# Patient Record
Sex: Male | Born: 1970 | Hispanic: No | Smoking: Never smoker
Health system: Southern US, Community
[De-identification: ages and names within clinical notes are randomized; demographics above are authoritative.]

## PROBLEM LIST (undated history)

## (undated) DIAGNOSIS — Z86010 Personal history of colonic polyps: Secondary | ICD-10-CM

## (undated) HISTORY — PX: COLONOSCOPY: SHX5424

---

## 2007-05-06 ENCOUNTER — Encounter: Payer: Self-pay | Admitting: Emergency Medicine

## 2007-05-07 ENCOUNTER — Encounter (INDEPENDENT_AMBULATORY_CARE_PROVIDER_SITE_OTHER): Payer: Self-pay | Admitting: Internal Medicine

## 2007-05-07 ENCOUNTER — Inpatient Hospital Stay (HOSPITAL_COMMUNITY): Admission: EM | Admit: 2007-05-07 | Discharge: 2007-05-10 | Payer: Self-pay | Admitting: Internal Medicine

## 2007-05-07 ENCOUNTER — Ambulatory Visit: Payer: Self-pay | Admitting: Internal Medicine

## 2011-02-20 NOTE — Discharge Summary (Signed)
[name] Moore, [name] Moore           ACCOUNT NO.:  [id number]   MEDICAL RECORD NO.:  [id number]          PATIENT TYPE:  INP   LOCATION:  3729                         FACILITY:  [facility]   PHYSICIAN:  Altha Harm, MDDATE OF BIRTH:  08/26/1971   DATE OF ADMISSION:  05/07/2007  DATE OF DISCHARGE:  05/08/2007                               DISCHARGE SUMMARY   FINAL DISCHARGE DIAGNOSES:  1. Atypical chest pain.  2. History of gastroesophageal reflux disease.  3. Hyperlipidemia.   DISCHARGE MEDICATIONS:  1. Pravastatin 40 mg p.o. daily.  2. Prilosec OTC 20 mg p.o. daily.   CONSULTATIONS:  Dr. Sharyn Lull, cardiology.   PROCEDURES:  None.   DIAGNOSTIC STUDIES:  1. Stress Myoview.  Stress test negative but awaiting results of      myoview.  2. 2D echocardiogram which shows no wall motion abnormalities, normal      left ventricular function with an ejection fraction of 65%, and      mildly increased left ventricular wall built thickness.   CODE STATUS:  FULL CODE.   PRIMARY CARE PHYSICIAN:  Presently unassigned.   CHIEF COMPLAINT:  Chest pain.   HISTORY OF PRESENT ILLNESS:  Please see the H and P dictated by Dr.  Roxan Hockey for details of the HPI.   HOSPITAL COURSE:  1. Chest pain:  The patient had a complaint of chest pain and was      ruled out with serial enzymes for resting ischemia.  With the      patient's gender, history of tobacco use disorder, and history of      hyperlipidemia, it was thought that it was prudent for the patient      to undergo a stress test prior to discharge.  The patient underwent      the stress test.  From preliminary findings, the stress test is      negative.  The Myoview portion of the stress is still pending.  2. Hyperlipidemia:  The patient was found to have elevated      triglycerides of 225, and low HDL.  The patient was started on      Pravastatin to be taken on a daily basis as an outpatient.  The      patient has been advised to follow up  with a primary care physician      to have the LFTs and cholesterol checked within 6 weeks.  Please      note that the patient at baseline, LFTs are all normal.  3. Tobacco use disorder:  The patient is counseled against tobacco      cessation.  However, he is in a precontemplative state at the      moment.  4. History of gastroesophageal reflux disease:  The patient states      that he has a history of reflux and was taking Omeprazole, which he      has discontinued by himself in the past.  The patient is restarted      on Prilosec OTC as an outpatient and is to continue until followed      up by his  primary care physician.   CONDITION AT THE TIME OF DISCHARGE:  Stable.   DIETARY RESTRICTIONS:  The patient should be on a low-cholesterol diet.   PHYSICAL RESTRICTIONS:  None.   FOLLOWUP:  The patient is to follow up with a primary care physician  within 1 week of discharge if myoview turns out to be normal.      Altha Harm, MD  Electronically Signed     MAM/MEDQ  D:  05/08/2007  T:  05/08/2007  Job:  [id number]   cc:   Eduardo Osier. Sharyn Lull, M.D.

## 2011-02-20 NOTE — Discharge Summary (Signed)
[name] Moore, [name] Moore           ACCOUNT NO.:  [id number]   MEDICAL RECORD NO.:  [id number]          PATIENT TYPE:  INP   LOCATION:  3729                         FACILITY:  [facility]   PHYSICIAN:  Altha Harm, MDDATE OF BIRTH:  1971-01-27   DATE OF ADMISSION:  05/07/2007  DATE OF DISCHARGE:  05/10/2007                               DISCHARGE SUMMARY   ADDENDUM   DISPOSITION:  To home.   ADDITIONAL DISCHARGE DIAGNOSIS:  Nonocclusive coronary artery disease.   ADDITIONAL MEDICATIONS:  Aspirin 81 mg p.o. daily.   HOSPITAL COURSE:  The patient was found to have a wall motion defect on  the Myoview.  Thus the patient was scheduled for and underwent a cardiac  catheterization.  Cardiac catheterization showed nonocclusive vessel  disease in the range of 10-15%.  Recommendations were made for aspirin  to be taken on a daily basis, and risk modification with cholesterol  control.   The patient also has a tobacco use disorder and has been counseled  against tobacco smoking.  The patient states that he will cut down on  his tobacco use and approach his primary care physician for assistance  in smoking cessation.  Smoking cessation counseling was done while the  patient was in the hospital; however, the patient does not want to  embark on an outpatient program at this time.   CONDITION ON DISCHARGE:  The patient's condition on discharge in stable.  The patient is up, ambulating.  He has had no post catheterization  complications.      Altha Harm, MD  Electronically Signed     MAM/MEDQ  D:  05/10/2007  T:  05/10/2007  Job:  [id number]   cc:   Eduardo Osier. Sharyn Lull, M.D.

## 2011-02-20 NOTE — Cardiovascular Report (Signed)
Moore, [name]           ACCOUNT NO.:  [id number]   MEDICAL RECORD NO.:  [id number]          PATIENT TYPE:  INP   LOCATION:  3729                         FACILITY:  [facility]   PHYSICIAN:  Mohan N. Sharyn Lull, M.D. DATE OF BIRTH:  1971/03/20   DATE OF PROCEDURE:  05/09/2007  DATE OF DISCHARGE:                            CARDIAC CATHETERIZATION   PROCEDURE:  Left cardiac cath with selective left and right coronary  angiography, LV graft via right groin using Judkins technique.   INDICATIONS FOR PROCEDURE:  Mr. [name] Moore is a 40 year old [place]  male with past medical history significant for hypercholesteremia,  tobacco abuse, was admitted due to recurrent retrosternal chest pain  described as pressure like someone standing on the chest off and on  since yesterday, without associated nausea, vomiting.  Pain was  localized grade 10/10, states that chest pain occasionally increases  with deep breathing.  Denies any cough, fever or chills.  Denies  exertional chest pain.  Denies palpitation, lightheadedness or syncope.  Denies PND, orthopnea or leg swelling.   PAST MEDICAL HISTORY:  As above.   PAST SURGICAL HISTORY:  None.   ALLERGIES:  None.   MEDICATIONS:  None.   SOCIAL HISTORY:  He is a widower.  His wife died of breast cancer  recently.  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  Smokes pipe for 10+ years, no  history of alcohol abuse.   FAMILY HISTORY:  Is positive for cancer.   PHYSICAL EXAMINATION:  On examination he is alert and oriented x3 in no  acute distress.  Blood pressure was 110/62, pulse was 64.  Conjunctivae  was pink.  NECK:  Supple.  No JVD, no bruit.  LUNGS:  Were clear to auscultation without rhonchi or rales.  CARDIOVASCULAR:  S1 and S2 was normal.  There was no S3 gallop or  murmur.  ABDOMEN was soft.  Bowel sounds were present, nontender.  EXTREMITIES:  There is no clubbing, cyanosis  or edema.   EKG showed normal sinus rhythm with no acute ischemic changes.  Three  sets of cardiac enzymes were negative.  The patient subsequently  underwent stress Myoview, exercised for 10 minutes on Bruce protocol.  Peak heart rate achieved was 176, peak blood pressure was 168/69.  EKG  showed normal sinus rhythm with no acute ischemic changes.  At the peak  of exercise the patient had occasional ventricular quadrigeminy.  His  Myoview scan showed reversible ischemia of the anterior wall with normal  ejection fraction.  Due to chest pain, positive stress Myoview discussed  with the patient and his brother-in-law regarding left cath, possible  PTCA stenting, its risks and benefits, i.e. death, MI, stroke, need for  emergency CABG, risk of restenosis, local vascular complications, etc.,  accepted and consented for the procedure.   PROCEDURE:  After obtaining informed consent the patient was brought to  the cath lab and was placed on fluoroscopy table.  The right groin was  prepped and draped in usual fashion.  2% Xylocaine was used for local  anesthesia in the right groin.  With the help of thin-wall needle 6-  French arterial sheath was placed.  Sheath was aspirated and flushed.  Next a 6-French left Judkins catheter was advanced over the wire under  fluoroscopic guidance up to the ascending aorta.  Wire was pulled out,  the catheter was aspirated and connected to the manifold.  Catheter was  further advanced and engaged into left  coronary ostium.  Multiple views  of the left system were taken.  Next, catheter was disengaged and was  pulled out over the wire and was replaced with 6-French right Judkins  catheter which was advanced over the wire under fluoroscopic guidance up  to the ascending aorta.  Wire was pulled out, the catheter was aspirated  and connected to the manifold.  Catheter was further advanced and  engaged into right coronary ostium.  Multiple views of the right  system  were taken.  Next, the catheter was disengaged and was pulled out over  the wire and was replaced with 6-French pigtail catheter which was  advanced over the wire under fluoroscopic guidance up to the ascending  aorta.  Wire was pulled, out the catheter was aspirated and connected to  the manifold.  Catheter was further advanced across the aortic valve  into the LV.  LV pressures were recorded.  Next, LV grafting was done in  30 dB RAO position.  Post angiographic pressures were recorded from LV  and then pullback pressures were recorded from the aorta.  There was no  gradient across the aortic valve.  Next a pigtail catheter was pulled  out over the wire.  Sheaths aspirated and flushed.  Of note, the  patient's left main was very short, left main circumflex system was  cannulated with JR-4 non selectively and LAD was cannulated with a JL-  3.5 selectively.   FINDINGS:  LV showed good LV systolic function, EF of 55-60%.  Left main  was short which was patent.  LAD has 10-15% proximal and mid stenosis.  Diagonal one was very very small. Diagonal 2 and 3 were small which were  patent.  Left circumflex was large which has 10-15% ostial stenosis.  OM1 and OM-2 were very very small.  OM-3 was large which was patent.  OM-  4 was small which was patent.  RCA was small, nondominant which was  patent.  The patient had left dominant coronary system.  The patient  tolerated procedure well.  There are no complications.  The patient was  transferred to recovery room in stable condition.      [name] Moore. Sharyn Lull, M.D.  Electronically Signed     MNH/MEDQ  D:  05/09/2007  T:  05/09/2007  Job:  [id number]   cc:   [employer]

## 2011-02-20 NOTE — H&P (Signed)
NAMEConni Moore DECLYN, DELSOL           ACCOUNT NO.:  [id number]   MEDICAL RECORD NO.:  [id number]          PATIENT TYPE:  EMS   LOCATION:  ED                           [facility]ACILITY[facility]   PHYSICIAN:  Della Goo, M.D. DATE OF BIRTH:  04/18/1971   DATE OF ADMISSION:  05/06/2007  DATE OF DISCHARGE:  05/07/2007                              HISTORY & PHYSICAL   This is an unassigned patient.   CHIEF COMPLAINT:  Chest pain.   HISTORY OF PRESENT ILLNESS:  This is a 40 year old male who presented to  the emergency department secondary to complaints of several episodes of  chest pain that began at approximately 5 p.m. in the afternoon prior to  admission.  He reports taking Tylenol after the first episode and the  pain resolved.  He reports the pain later returned in the evening with  exertion, rated the intensity of the pain at that time of 10/10, and it  was unrelieved until arrival in the emergency department and the patient  was given nitroglycerin sublingual x1.  He reports having shortness of  breath and diaphoresis associated with this pain.  No nausea or  vomiting.  The pain was located in the mid chest area without radiation.  Described the pain as being a dull, tight feeling.  He denies having any  previous similar episodes.  The patient does have risk factors of  tobacco usage, smokes a pipe.  He has no family history of coronary  artery disease that he knows of.   PAST MEDICAL HISTORY:  No previous medical problems.   PAST SURGICAL HISTORY:  None.   MEDICATIONS:  None.   ALLERGIES:  No known drug allergies.   SOCIAL HISTORY:  The patient is a widower.  He is visiting from [place] at  this time.  He has two children.  His wife died approximately eight  months ago of breast cancer at age 5.  Tobacco history:  Smokes a pipe.  No history of alcohol usage and no history of illicit drug usage.   FAMILY HISTORY:  Negative for coronary artery disease, hypertension,  diabetes and  cancers.   REVIEW OF SYSTEMS:  Pertinents are mentioned above.   PHYSICAL EXAMINATION FINDINGS:  This is a 40 year old overweight male in  discomfort but no acute distress.  VITAL SIGNS:  Temperature 97.5, blood pressure 124/73, heart rate 87,  respirations 16, O2 saturation 99% on 2 L nasal cannula oxygen.  HEENT:  Normocephalic, atraumatic.  Pupils equally round, reactive to  light.  Extraocular muscles are intact.  Funduscopic benign.  Oropharynx  is clear.  NECK:  Supple, full range of motion.  No thyromegaly, adenopathy or  jugular venous distension.  CARDIOVASCULAR:  Regular rate and rhythm.  No murmurs, gallops or rubs.  LUNGS:  Clear to auscultation bilaterally.  ABDOMEN:  Positive bowel sounds, soft, nontender, nondistended.  No  hepatosplenomegaly.  No rebound or guarding.  EXTREMITIES:  Without cyanosis, clubbing or edema.  NEUROLOGIC:  Alert and oriented x3.  Nonfocal.   LABORATORY STUDIES:  White blood cell count 9.3, hemoglobin 16.5,  hematocrit 47.6, platelets 238, neutrophils 65%,  lymphocytes 30%.  Sodium 140, potassium 3.3, chloride 103, carbon dioxide 27, BUN 7,  creatinine 0.80, glucose 114.  Cardiac enzymes:  Myoglobin 35.5, CK-MB  less than 1.0, troponin less than 0.05.  D-dimer less than 0.22.  EKG  reveals a normal sinus rhythm without acute ST-segment changes.   ASSESSMENT:  This is a 40 year old male being admitted with:  1. Chest pain.  2. Hypokalemia.  3. Night sweats.   PLAN:  The patient will be admitted to a telemetry area for cardiac  monitoring and cardiac enzymes will be performed.  The patient will be  placed on medical therapy of nitroglycerin paste, oxygen, aspirin, GI  and DVT prophylaxis.  The patient will receive replacement potassium  therapy.  He will be monitored for further changes.      Della Goo, M.D.  Electronically Signed     HJ/MEDQ  D:  05/07/2007  T:  05/07/2007  Job:  [id number]

## 2011-07-23 LAB — COMPREHENSIVE METABOLIC PANEL
ALT: 31
AST: 14
Calcium: 9.6
Creatinine, Ser: 0.79
GFR calc Af Amer: >60
GFR calc non Af Amer: >60
Glucose, Bld: 79
Sodium: 140
Total Protein: 6.6

## 2011-07-23 LAB — CK TOTAL AND CKMB (NOT AT ARMC)
CK, MB: 1
Relative Index: 0.8
Relative Index: INVALID
Relative Index: INVALID
Total CK: 45
Total CK: 45

## 2011-07-23 LAB — CBC
MCV: 84.8
RBC: 5.61
WBC: 9.3

## 2011-07-23 LAB — TROPONIN I
Troponin I: 0.01
Troponin I: 0.02

## 2011-07-23 LAB — BASIC METABOLIC PANEL
Chloride: 103
Creatinine, Ser: 0.8
GFR calc Af Amer: >60
GFR calc non Af Amer: >60

## 2011-07-23 LAB — APTT: aPTT: 36

## 2011-07-23 LAB — POCT CARDIAC MARKERS
CKMB, poc: <1 — ABNORMAL LOW
Myoglobin, poc: 35.5
Operator id: 4533
Operator id: 4533
Troponin i, poc: <0.05

## 2011-07-23 LAB — LIPID PANEL
Cholesterol: 162
LDL Cholesterol: 99
Total CHOL/HDL Ratio: 9

## 2011-07-23 LAB — B-NATRIURETIC PEPTIDE (CONVERTED LAB)
Pro B Natriuretic peptide (BNP): <30
Pro B Natriuretic peptide (BNP): <30

## 2011-07-23 LAB — DIFFERENTIAL
Eosinophils Absolute: 0.2
Lymphocytes Relative: 30
Lymphs Abs: 2.7
Monocytes Relative: 3
Neutro Abs: 6
Neutrophils Relative %: 65

## 2011-07-23 LAB — TSH: TSH: 3.098

## 2014-03-08 ENCOUNTER — Encounter: Payer: Self-pay | Admitting: Gastroenterology

## 2014-05-05 ENCOUNTER — Encounter (HOSPITAL_COMMUNITY): Payer: Self-pay | Admitting: Emergency Medicine

## 2014-05-05 ENCOUNTER — Emergency Department (HOSPITAL_COMMUNITY)
Admission: EM | Admit: 2014-05-05 | Discharge: 2014-05-05 | Disposition: A | Discharge status: Home / Self Care | Payer: Medicaid Other | Attending: Emergency Medicine | Admitting: Emergency Medicine

## 2014-05-05 ENCOUNTER — Emergency Department (HOSPITAL_COMMUNITY): Payer: Medicaid Other

## 2014-05-05 DIAGNOSIS — M25569 Pain in unspecified knee: Secondary | ICD-10-CM | POA: Diagnosis not present

## 2014-05-05 DIAGNOSIS — Z79899 Other long term (current) drug therapy: Secondary | ICD-10-CM | POA: Diagnosis not present

## 2014-05-05 DIAGNOSIS — Z87828 Personal history of other (healed) physical injury and trauma: Secondary | ICD-10-CM | POA: Diagnosis not present

## 2014-05-05 DIAGNOSIS — M25561 Pain in right knee: Secondary | ICD-10-CM

## 2014-05-05 MED ORDER — HYDROCODONE-ACETAMINOPHEN 5-325 MG PO TABS: ORAL_TABLET | ORAL | Status: DC

## 2014-05-05 MED ORDER — MELOXICAM 7.5 MG PO TABS: 7.5000 mg | ORAL_TABLET | Freq: Every day | ORAL | Status: DC

## 2014-05-05 NOTE — ED Notes (Signed)
C/O right knee pain x 6 months. No known injury. States had xray 3 months ago "they didn't find anything". No deformity.

## 2014-05-05 NOTE — ED Provider Notes (Signed)
Medical screening examination/treatment/procedure(s) were performed by non-physician practitioner and as supervising physician I was immediately available for consultation/collaboration.   EKG Interpretation None        William Kalie Cabral, MD 05/05/14 1446 

## 2014-05-05 NOTE — ED Provider Notes (Signed)
CSN: [id number]     Arrival date & time 05/05/14  0945 History  This chart was scribed for non-physician practitioner, Renne Crigler, PA-C, working with Dagmar Hait, MD by Charline Bills, ED Scribe. This patient was seen in room TR09C/TR09C and the patient's care was started at 11:45 AM.   Chief Complaint  Patient presents with  . Knee Pain   The history is provided by the patient. No language interpreter was used.   HPI Comments: [name] Moore is a 43 y.o. male who presents to the Emergency Department complaining of intermittent R knee pain onset 1 year ago following an injury. Pt states that pain resolved but returned 6 months ago. Pt has been seen for same symptoms and given pain medication with no relief. Pt has had XR obtained previously that showed no fractures. He reports intermittent clicking, popping and stiffness in his R knee. Pt also reports difficulty sleeping at times due to severity of pain. Pain is exacerbated with cold environments. Pt is currently taking Tramadol for pain.   History reviewed. No pertinent past medical history. History reviewed. No pertinent past surgical history. No family history on file. History  Substance Use Topics  . Smoking status: Never Smoker   . Smokeless tobacco: Not on file  . Alcohol Use: No    Review of Systems  Constitutional: Negative for activity change.  Musculoskeletal: Positive for arthralgias. Negative for back pain, gait problem, joint swelling and neck pain.  Skin: Negative for wound.  Neurological: Negative for weakness and numbness.   Allergies  Review of patient's allergies indicates no known allergies.  Home Medications   Prior to Admission medications   Medication Sig Start Date End Date Taking? Authorizing Provider  acetaminophen (TYLENOL) 500 MG tablet Take 1,000 mg by mouth every 6 (six) hours as needed for moderate pain or headache.   Yes Historical Provider, MD  omeprazole (PRILOSEC) 20 MG capsule Take  20 mg by mouth daily.   Yes Historical Provider, MD  traMADol (ULTRAM) 50 MG tablet Take 50 mg by mouth every 6 (six) hours as needed for moderate pain.   Yes Historical Provider, MD   Triage Vitals: BP 119/90  Temp(Src) 98.1 F (36.7 C) (Oral)  Resp 18  Ht 5\' 10"  (1.778 m)  Wt 215 lb (97.523 kg)  BMI 30.85 kg/m2  SpO2 97% Physical Exam  Nursing note and vitals reviewed. Constitutional: He appears well-developed and well-nourished.  HENT:  Head: Normocephalic and atraumatic.  Eyes: Conjunctivae are normal.  Neck: Normal range of motion. Neck supple.  Cardiovascular: Normal pulses.   Pulmonary/Chest: Effort normal.  Musculoskeletal: He exhibits tenderness. He exhibits no edema.       Right hip: Normal.       Right knee: He exhibits normal range of motion and no swelling. Tenderness found. Medial joint line tenderness noted. No lateral joint line tenderness noted.       Right ankle: Normal.  Crepitus felt with ROM of R knee.   Neurological: He is alert. No sensory deficit.  Motor, sensation, and vascular distal to the injury is fully intact.   Skin: Skin is warm and dry.  Psychiatric: He has a normal mood and affect. His behavior is normal.   ED Course  Procedures (including critical care time) DIAGNOSTIC STUDIES: Oxygen Saturation is 97% on RA, normal by my interpretation.    COORDINATION OF CARE: 11:52 AM-Discussed treatment plan which includes XR, anti-inflammatories, Vicodin, and referral to orthopedist with pt at bedside and  pt agreed to plan.   Labs Review Labs Reviewed - No data to display  Imaging Review Dg Knee Complete 4 Views Right  05/05/2014   CLINICAL DATA:  Chronic medial knee pain, twisting injury 1 year ago  EXAM: RIGHT KNEE - COMPLETE 4+ VIEW  COMPARISON:  None  FINDINGS: Osseous demineralization.  Joint spaces preserved.  No acute fracture, dislocation or bone destruction.  Small bone island at medial tibial metaphysis incidentally noted.  No knee joint  effusion.  IMPRESSION: Osseous demineralization.  No acute abnormalities.   Electronically Signed   By: Ulyses SouthwardMark  Boles M.D.   On: 05/05/2014 11:23    EKG Interpretation None      Vital signs reviewed and are as follows: Filed Vitals:   05/05/14 1005  BP: 119/90  Temp: 98.1 F (36.7 C)  Resp: 18   Patient informed of x-ray results.  Patient counseled on use of narcotic pain medications. Counseled not to combine these medications with others containing tylenol. Urged not to drink alcohol, drive, or perform any other activities that requires focus while taking these medications. The patient verbalizes understanding and agrees with the plan.   MDM   Final diagnoses:  Right knee pain   Patient with right knee pain, history of remote injury. No joint swelling. Full range of motion with minimal pain. Do not suspect joint infection. Imaging is negative. Will treat with anti-inflammatories. Low-dose meloxicam given history of ulcers. Orthopedic followup given and encouraged.  I personally performed the services described in this documentation, which was scribed in my presence. The recorded information has been reviewed and is accurate.    Renne CriglerJoshua Danzig Macgregor, PA-C 05/05/14 1201

## 2014-05-05 NOTE — Discharge Instructions (Signed)
Please read and follow all provided instructions.  Your diagnoses today include:  1. Right knee pain     Tests performed today include:  An x-ray of the affected area - does NOT show any broken bones  Vital signs. See below for your results today.   Medications prescribed:   Vicodin (hydrocodone/acetaminophen) - narcotic pain medication  DO NOT drive or perform any activities that require you to be awake and alert because this medicine can make you drowsy. BE VERY CAREFUL not to take multiple medicines containing Tylenol (also called acetaminophen). Doing so can lead to an overdose which can damage your liver and cause liver failure and possibly death.   Meloxicam - anti-inflammatory pain medication  You have been prescribed an anti-inflammatory medication or NSAID. Take with food. Do not take aspirin, ibuprofen, or naproxen if taking this medication. Take smallest effective dose for the shortest duration needed for your pain. Stop taking if you experience stomach pain or vomiting.   Take any prescribed medications only as directed.  Home care instructions:   Follow any educational materials contained in this packet  Follow R.I.C.E. Protocol:  R - rest your injury   I  - use ice on injury without applying directly to skin  C - compress injury with bandage or splint  E - elevate the injury as much as possible  Follow-up instructions: Please follow-up with the provided orthopedic physician for further evaluation.    Return instructions:   Please return if your toes are numb or tingling, appear gray or blue, or you have severe pain (also elevate leg and loosen splint or wrap if you were given one)  Please return to the Emergency Department if you experience worsening symptoms.   Please return if you have any other emergent concerns.  Additional Information:  Your vital signs today were: BP 119/90   Temp(Src) 98.1 F (36.7 C) (Oral)   Resp 18   Ht 5\' 10"  (1.778 m)    Wt 215 lb (97.523 kg)   BMI 30.85 kg/m2   SpO2 97% If your blood pressure (BP) was elevated above 135/85 this visit, please have this repeated by your doctor within one month. --------------

## 2014-05-05 NOTE — ED Notes (Signed)
Pt c/o right knee pain x 6 months. sts he has been seen for the same and given pain medicine but no relief. sts he has had an xray and was told nothing was broken. Pt sts he is concerned because he feels like his bone is sticking out more on the right side then the left. sts he tried going to his PCP but his office was closed today. Ambulatory with no difficulties. Nad, skin warm and dry, resp e/u.

## 2014-05-28 ENCOUNTER — Ambulatory Visit: Payer: Self-pay | Admitting: Gastroenterology

## 2014-12-12 ENCOUNTER — Encounter (HOSPITAL_COMMUNITY): Payer: Self-pay | Admitting: *Deleted

## 2014-12-12 ENCOUNTER — Emergency Department (HOSPITAL_COMMUNITY)
Admission: EM | Admit: 2014-12-12 | Discharge: 2014-12-12 | Disposition: A | Discharge status: Home / Self Care | Payer: Medicaid Other | Attending: Emergency Medicine | Admitting: Emergency Medicine

## 2014-12-12 DIAGNOSIS — M25511 Pain in right shoulder: Secondary | ICD-10-CM | POA: Insufficient documentation

## 2014-12-12 DIAGNOSIS — Z791 Long term (current) use of non-steroidal anti-inflammatories (NSAID): Secondary | ICD-10-CM | POA: Diagnosis not present

## 2014-12-12 DIAGNOSIS — M542 Cervicalgia: Secondary | ICD-10-CM

## 2014-12-12 DIAGNOSIS — M62838 Other muscle spasm: Secondary | ICD-10-CM

## 2014-12-12 DIAGNOSIS — Z79899 Other long term (current) drug therapy: Secondary | ICD-10-CM | POA: Insufficient documentation

## 2014-12-12 MED ORDER — METHOCARBAMOL 500 MG PO TABS: 500.0000 mg | ORAL_TABLET | Freq: Two times a day (BID) | ORAL | Status: DC

## 2014-12-12 MED ORDER — OXYCODONE-ACETAMINOPHEN 5-325 MG PO TABS: 1.0000 | ORAL_TABLET | ORAL | Status: DC | PRN

## 2014-12-12 MED ORDER — IBUPROFEN 600 MG PO TABS: 600.0000 mg | ORAL_TABLET | Freq: Four times a day (QID) | ORAL | Status: DC | PRN

## 2014-12-12 MED ORDER — TRAMADOL HCL 50 MG PO TABS: 50.0000 mg | ORAL_TABLET | Freq: Four times a day (QID) | ORAL | Status: DC | PRN

## 2014-12-12 NOTE — ED Notes (Signed)
Declined W/C at D/C and was escorted to lobby by RN. 

## 2014-12-12 NOTE — ED Provider Notes (Signed)
CSN: [id number]     Arrival date & time 12/12/14  1618 History  This chart was scribed for non-physician practitioner Junius FinnerErin O'Malley working with Flint MelterElliott L Wentz, MD by Murriel HopperAlec Bankhead, ED Scribe. This patient was seen in room TR11C/TR11C and the patient's care was started at 4:52 PM.    Chief Complaint  Patient presents with  . Torticollis      The history is provided by the patient. No language interpreter was used.     HPI Comments: [name] Moore is a 44 y.o. male who presents to the Emergency Department complaining of constant pain in the right side of his neck with associated right shoulder pain that has been present for two days. Pt denies any known injury to the area. States pain started after waking up the other day.  Reports using a new pillow he believed may have caused his symptoms but switched back to his old pillow w/o relief.  Pt states he applied Voltaren to shoulder and neck last night with minimal relief. Pt states that his pain is worst with flexion of the neck, but denies pain with extension or rotation to the left or the right. Pt denies any new physical activity at work or at home. Pt denies numbness and tingling of arms. Pt denies having any neck problems in the past. Denies fever, chills, n/v/d.    History reviewed. No pertinent past medical history. History reviewed. No pertinent past surgical history. No family history on file. History  Substance Use Topics  . Smoking status: Never Smoker   . Smokeless tobacco: Not on file  . Alcohol Use: No    Review of Systems  Musculoskeletal: Positive for myalgias, neck pain and neck stiffness.  Neurological: Negative for numbness.      Allergies  Review of patient's allergies indicates no known allergies.  Home Medications   Prior to Admission medications   Medication Sig Start Date End Date Taking? Authorizing Provider  acetaminophen (TYLENOL) 500 MG tablet Take 1,000 mg by mouth every 6 (six) hours as needed for  moderate pain or headache.    Historical Provider, MD  HYDROcodone-acetaminophen (NORCO/VICODIN) 5-325 MG per tablet Take 1-2 tablets every 6 hours as needed for severe pain 05/05/14   Renne CriglerJoshua Geiple, PA-C  ibuprofen (ADVIL,MOTRIN) 600 MG tablet Take 1 tablet (600 mg total) by mouth every 6 (six) hours as needed. 12/12/14   Junius FinnerErin O'Malley, PA-C  meloxicam (MOBIC) 7.5 MG tablet Take 1 tablet (7.5 mg total) by mouth daily. 05/05/14   Renne CriglerJoshua Geiple, PA-C  methocarbamol (ROBAXIN) 500 MG tablet Take 1 tablet (500 mg total) by mouth 2 (two) times daily. 12/12/14   Junius FinnerErin O'Malley, PA-C  omeprazole (PRILOSEC) 20 MG capsule Take 20 mg by mouth daily.    Historical Provider, MD  traMADol (ULTRAM) 50 MG tablet Take 50 mg by mouth every 6 (six) hours as needed for moderate pain.    Historical Provider, MD  traMADol (ULTRAM) 50 MG tablet Take 1 tablet (50 mg total) by mouth every 6 (six) hours as needed. 12/12/14   Junius FinnerErin O'Malley, PA-C   BP 131/89 mmHg  Pulse 78  Temp(Src) 97.9 F (36.6 C) (Oral)  Resp 18  Ht 5\' 10"  (1.778 m)  Wt 212 lb (96.163 kg)  BMI 30.42 kg/m2  SpO2 99% Physical Exam  Constitutional: He is oriented to person, place, and time. He appears well-developed and well-nourished.  HENT:  Head: Normocephalic.  Eyes: EOM are normal. Pupils are equal, round, and reactive to light.  Neck: Normal range of motion. Neck supple.  Cardiovascular: Normal rate, regular rhythm and normal heart sounds.   Pulses:      Radial pulses are 2+ on the right side, and 2+ on the left side.  Pulmonary/Chest: Effort normal. No respiratory distress. He has no wheezes.  Abdominal: He exhibits no distension.  Musculoskeletal: Normal range of motion. He exhibits tenderness. He exhibits no edema.  Point tenderness to C7 and bilateral muscles of upper trapezius with point tenderness and palpable muscle spasm of right upper trapezius. Increased pain with abduction of right arm.  5/5 strength grip strength bilaterally.    Neurological: He is alert and oriented to person, place, and time.  Skin: Skin is warm and dry. No rash noted. No erythema.  Psychiatric: He has a normal mood and affect.  Nursing note and vitals reviewed.   ED Course  Procedures (including critical care time)  DIAGNOSTIC STUDIES: Oxygen Saturation is 99% on RA, normal by my interpretation.    COORDINATION OF CARE: 4:57 PM Discussed treatment plan with pt at bedside and pt agreed to plan.   Labs Review Labs Reviewed - No data to display  Imaging Review No results found.   EKG Interpretation None      MDM   Final diagnoses:  Muscle spasms of neck  Acute neck pain   Pt presenting to ED with c/o neck pain, appears muscular in nature.  Pt has a palpable muscle spasm and point tenderness to right upper trapezius.  Pt appears well, non-toxic, afebrile. Doubt meningitis.  Will tx for muscle spasm. Offered pt percocet, however, pt requested tramadol stating percocet causes him to vomit. Rx: tramadol and robaxin.  Advised to follow up with his PCP next week for recheck of symptoms if not improving. Return precautions provided. Pt verbalized understanding and agreement with tx plan.   I personally performed the services described in this documentation, which was scribed in my presence. The recorded information has been reviewed and is accurate.    Junius Finner, PA-C 12/12/14 2003  Flint Melter, MD 12/13/14 [phone]

## 2014-12-12 NOTE — ED Notes (Signed)
No pain in his neck if he lies backward only when he tilts his head forward

## 2014-12-12 NOTE — ED Notes (Signed)
The pt is c/o neck pain for 2 days.  Less pain today than yesterday after using an ointment.  No previous history

## 2015-10-16 ENCOUNTER — Emergency Department (HOSPITAL_COMMUNITY): Payer: Medicaid Other

## 2015-10-16 ENCOUNTER — Emergency Department (HOSPITAL_COMMUNITY)
Admission: EM | Admit: 2015-10-16 | Discharge: 2015-10-16 | Disposition: A | Discharge status: Home / Self Care | Payer: Medicaid Other | Attending: Emergency Medicine | Admitting: Emergency Medicine

## 2015-10-16 ENCOUNTER — Encounter (HOSPITAL_COMMUNITY): Payer: Self-pay

## 2015-10-16 DIAGNOSIS — W01198A Fall on same level from slipping, tripping and stumbling with subsequent striking against other object, initial encounter: Secondary | ICD-10-CM | POA: Insufficient documentation

## 2015-10-16 DIAGNOSIS — S40012A Contusion of left shoulder, initial encounter: Secondary | ICD-10-CM | POA: Insufficient documentation

## 2015-10-16 DIAGNOSIS — Y9289 Other specified places as the place of occurrence of the external cause: Secondary | ICD-10-CM | POA: Insufficient documentation

## 2015-10-16 DIAGNOSIS — S4992XA Unspecified injury of left shoulder and upper arm, initial encounter: Secondary | ICD-10-CM | POA: Diagnosis present

## 2015-10-16 DIAGNOSIS — Y999 Unspecified external cause status: Secondary | ICD-10-CM | POA: Diagnosis not present

## 2015-10-16 DIAGNOSIS — Y9389 Activity, other specified: Secondary | ICD-10-CM | POA: Diagnosis not present

## 2015-10-16 DIAGNOSIS — Z791 Long term (current) use of non-steroidal anti-inflammatories (NSAID): Secondary | ICD-10-CM | POA: Insufficient documentation

## 2015-10-16 DIAGNOSIS — S299XXA Unspecified injury of thorax, initial encounter: Secondary | ICD-10-CM | POA: Insufficient documentation

## 2015-10-16 DIAGNOSIS — Z79899 Other long term (current) drug therapy: Secondary | ICD-10-CM | POA: Insufficient documentation

## 2015-10-16 MED ORDER — IBUPROFEN 600 MG PO TABS: 600.0000 mg | ORAL_TABLET | Freq: Four times a day (QID) | ORAL | Status: DC | PRN

## 2015-10-16 MED ORDER — TRAMADOL HCL 50 MG PO TABS: 50.0000 mg | ORAL_TABLET | Freq: Four times a day (QID) | ORAL | Status: DC | PRN

## 2015-10-16 MED ORDER — IBUPROFEN 400 MG PO TABS
800.0000 mg | ORAL_TABLET | Freq: Once | ORAL | Status: AC
  Administered 2015-10-16: 800 mg via ORAL
  Filled 2015-10-16: qty 2

## 2015-10-16 MED ORDER — METHOCARBAMOL 500 MG PO TABS: 500.0000 mg | ORAL_TABLET | Freq: Two times a day (BID) | ORAL | Status: DC

## 2015-10-16 NOTE — Discharge Instructions (Signed)
How to Use a Sling A sling is a type of hanging bandage. You wear it around your neck to protect an injured arm, shoulder, or other body part. You may need to wear a sling to keep you from moving the injured body part while it heals. Keeping the injured part of your body still reduces pain and speeds up healing. Your doctor may suggest you use a sling if you have:  A broken arm.  A broken collarbone.  A shoulder injury.  Surgery. RISKS AND COMPLICATIONS Wearing a sling the wrong way can:  Make your injury worse.  Cause stiffness or numbness.  Affect blood circulation in your arm and hand. This can cause tingling or numbness in your fingers or hands. HOW TO USE A SLING The way that you should use a sling depends on your injury. It is important that you follow all of your doctor's instructions for your injury. Also follow these general suggestions:  Wear the sling so that your arm bends 90 degrees at the elbow. That is like a right angle or the shape of a capital letter "L." The sling should also support your wrist and hand.  Try not to move your arm.  Do not lie down flat on your back while you have to wear a sling. Sleep in a recliner or use pillows to raise your upper body in bed.  Do not twist, raise, or move your arm in a way that could make your injury worse.  Do not lean on your arm while you have to wear a sling.  Do not lift anything while you have to wear a sling. GET HELP IF:  You have bruising, swelling, or pain that is getting worse.  Your pain medicine is not helping.  You have a fever. GET HELP RIGHT AWAY IF:  Your fingers are numb or tingling.  Your fingers turn blue or feel cold to the touch.  You cannot control the bleeding from your injury.  You are short of breath.   This information is not intended to replace advice given to you by your health care provider. Make sure you discuss any questions you have with your health care provider.   Document  Released: 12/19/2009 Document Revised: 10/15/2014 Document Reviewed: 07/28/2014 Elsevier Interactive Patient Education 2016 Else[employer] RICE for Routine Care of Injuries Theroutine careofmanyinjuriesincludes rest, ice, compression, and elevation (RICE therapy). RICE therapy is often recommended for injuries to soft tissues, such as a muscle strain, ligament injuries, bruises, and overuse injuries. It can also be used for some bony injuries. Using RICE therapy can help to relieve pain, lessen swelling, and enable your body to heal. Rest Rest is required to allow your body to heal. This usually involves reducing your normal activities and avoiding use of the injured part of your body. Generally, you can return to your normal activities when you are comfortable and have been given permission by your health care provider. Ice Icing your injury helps to keep the swelling down, and it lessens pain. Do not apply ice directly to your skin.  Put ice in a plastic bag.  Place a towel between your skin and the bag.  Leave the ice on for 20 minutes, 2-3 times a day. Do this for as long as you are directed by your health care provider. Compression Compression means putting pressure on the injured area. Compression helps to keep swelling down, gives support, and helps with discomfort. Compression may be done with an elastic bandage. If  an elastic bandage has been applied, follow these general tips:  Remove and reapply the bandage every 3-4 hours or as directed by your health care provider.  Make sure the bandage is not wrapped too tightly, because this can cut off circulation. If part of your body beyond the bandage becomes blue, numb, cold, swollen, or more painful, your bandage is most likely too tight. If this occurs, remove your bandage and reapply it more loosely.  See your health care provider if the bandage seems to be making your problems worse rather than better. Elevation Elevation means  keeping the injured area raised. This helps to lessen swelling and decrease pain. If possible, your injured area should be elevated at or above the level of your heart or the center of your chest. WHEN SHOULD I SEEK MEDICAL CARE? You should seek medical care if:  Your pain and swelling continue.  Your symptoms are getting worse rather than improving. These symptoms may indicate that further evaluation or further X-rays are needed. Sometimes, X-rays may not show a small broken bone (fracture) until a number of days later. Make a follow-up appointment with your health care provider. WHEN SHOULD I SEEK IMMEDIATE MEDICAL CARE? You should seek immediate medical care if:  You have sudden severe pain at or below the area of your injury.  You have redness or increased swelling around your injury.  You have tingling or numbness at or below the area of your injury that does not improve after you remove the elastic bandage.   This information is not intended to replace advice given to you by your health care provider. Make sure you discuss any questions you have with your health care provider.   Document Released: 01/06/2001 Document Revised: 06/15/2015 Document Reviewed: 09/01/2014 Elsevier Interactive Patient Education [employer] Else[employer] Inc[employer]

## 2015-10-16 NOTE — ED Notes (Signed)
Patient transported to X-ray 

## 2015-10-16 NOTE — ED Notes (Signed)
Pt slipped and fell on ice last night, hurt L shoulder, pain worse this AM, LOC for a few seconds.

## 2015-10-16 NOTE — ED Provider Notes (Signed)
CSN: [id number]     Arrival date & time 10/16/15  1726 History   First MD Initiated Contact with Patient 10/16/15 1831     No chief complaint on file.    (Consider location/radiation/quality/duration/timing/severity/associated sxs/prior Treatment) HPI   45 year old male presents for evaluation of a recent fall. Patient reports yesterday he fell on ice striking the left side of his body against the ground. Complaining of acute onset of pain to his left shoulder blade, left shoulder and left side of chest. Pain is most significant to his left shoulder blade and described as a sharp throbbing sensation worsening with movement. Pain radiates to his left side of chest without any shortness of breath productive cough or hemoptysis. He denies hitting his head but felt he may have a loss hospice for a few seconds. Denies any severe headache, nausea vomiting or focal numbness after the fall.Marland Kitchen He endorsed increasing pain to his left shoulder and chest with movement. He did take the tramadol earlier today without adequate relief. No complaint of any numbness or weakness. Denies any precipitating symptoms prior to the fall.  No past medical history on file. No past surgical history on file. No family history on file. Social History  Substance Use Topics  . Smoking status: Never Smoker   . Smokeless tobacco: Not on file  . Alcohol Use: No    Review of Systems  Constitutional: Negative for fever.  Respiratory: Negative for shortness of breath.   Cardiovascular: Positive for chest pain.  Gastrointestinal: Negative for abdominal pain.  Musculoskeletal: Positive for back pain.  All other systems reviewed and are negative.     Allergies  Review of patient's allergies indicates no known allergies.  Home Medications   Prior to Admission medications   Medication Sig Start Date End Date Taking? Authorizing Provider  acetaminophen (TYLENOL) 500 MG tablet Take 1,000 mg by mouth every 6 (six) hours as  needed for moderate pain or headache.    Historical Provider, MD  HYDROcodone-acetaminophen (NORCO/VICODIN) 5-325 MG per tablet Take 1-2 tablets every 6 hours as needed for severe pain 05/05/14   Renne Crigler, PA-C  ibuprofen (ADVIL,MOTRIN) 600 MG tablet Take 1 tablet (600 mg total) by mouth every 6 (six) hours as needed. 12/12/14   Junius Finner, PA-C  meloxicam (MOBIC) 7.5 MG tablet Take 1 tablet (7.5 mg total) by mouth daily. 05/05/14   Renne Crigler, PA-C  methocarbamol (ROBAXIN) 500 MG tablet Take 1 tablet (500 mg total) by mouth 2 (two) times daily. 12/12/14   Junius Finner, PA-C  omeprazole (PRILOSEC) 20 MG capsule Take 20 mg by mouth daily.    Historical Provider, MD  traMADol (ULTRAM) 50 MG tablet Take 50 mg by mouth every 6 (six) hours as needed for moderate pain.    Historical Provider, MD  traMADol (ULTRAM) 50 MG tablet Take 1 tablet (50 mg total) by mouth every 6 (six) hours as needed. 12/12/14   Junius Finner, PA-C   BP 133/86 mmHg  Pulse 79  Temp(Src) 98 F (36.7 C) (Oral)  Resp 18  SpO2 100% Physical Exam  Constitutional: He is oriented to person, place, and time. He appears well-developed and well-nourished. No distress.  HENT:  Head: Normocephalic and atraumatic.  No battle sign. No scalp tenderness.  Eyes: Conjunctivae are normal.  Neck: Normal range of motion. Neck supple.  Cardiovascular: Normal rate and regular rhythm.   Pulmonary/Chest: Effort normal and breath sounds normal. He exhibits tenderness (tenderness along left anterior upper chest wall on palpation  without crepitus or emphysema.).  Abdominal: Soft. There is no tenderness.  Musculoskeletal: He exhibits tenderness (Tenderness along the left medial shoulder blade on palpation without crepitus. Tenderness to left lateral deltoid on palpation. Left shoulder with full range of motion and no gross deformity.).  No significant midline spine tenderness, crepitus, or step-off noted.  Neurological: He is alert and oriented  to person, place, and time.  Skin: No rash noted.  Psychiatric: He has a normal mood and affect.  Nursing note and vitals reviewed.   ED Course  Procedures (including critical care time) Labs Review Labs Reviewed - No data to display  Imaging Review Dg Chest 2 View  10/16/2015  CLINICAL DATA:  Status post slip and fall on ice last night. Chest pain. Initial encounter. EXAM: CHEST  2 VIEW COMPARISON:  Single view of the chest 05/06/2007. FINDINGS: The lungs are clear. Heart size is normal. No pneumothorax or pleural effusion. No bony abnormality. IMPRESSION: Negative chest. Electronically Signed   By: Drusilla Kannerhomas  Dalessio M.D.   On: 10/16/2015 19:25   Dg Shoulder Left  10/16/2015  CLINICAL DATA:  Larey SeatFell on ice last night, left shoulder pain EXAM: LEFT SHOULDER - 2+ VIEW COMPARISON:  None. FINDINGS: Axillary view is limited. There is otherwise no evidence of fracture or dislocation. IMPRESSION: No acute findings Electronically Signed   By: Esperanza Heiraymond  Rubner M.D.   On: 10/16/2015 19:24   I have personally reviewed and evaluated these images and lab results as part of my medical decision-making.   EKG Interpretation None      MDM   Final diagnoses:  Shoulder contusion, left, initial encounter    BP 133/86 mmHg  Pulse 79  Temp(Src) 98 F (36.7 C) (Oral)  Resp 18  SpO2 100%   6:48 PM Patient had a mechanical fall when he slipped on ice yesterday. He is here with pain to his left shoulder blade, left shoulder and left anterior chest. Plan to obtain x-ray for further evaluation. Pain medication given.  7:42 PM X-ray of left shoulder and chest shows no acute fractures or dislocation. Patient will be provided with a sling for support, pain medication including NSAIDs and muscle relaxant was prescribed. Rice therapy discussed. Orthopedic referral given as needed.  Fayrene HelperBowie Chevon Fomby, PA-C 10/16/15 1944  Tilden FossaElizabeth Rees, MD 10/17/15 [phone]

## 2015-12-11 ENCOUNTER — Emergency Department (HOSPITAL_BASED_OUTPATIENT_CLINIC_OR_DEPARTMENT_OTHER): Payer: Medicaid Other

## 2015-12-11 ENCOUNTER — Encounter (HOSPITAL_BASED_OUTPATIENT_CLINIC_OR_DEPARTMENT_OTHER): Payer: Self-pay | Admitting: *Deleted

## 2015-12-11 ENCOUNTER — Emergency Department (HOSPITAL_BASED_OUTPATIENT_CLINIC_OR_DEPARTMENT_OTHER)
Admission: EM | Admit: 2015-12-11 | Discharge: 2015-12-11 | Disposition: A | Discharge status: Home / Self Care | Payer: Medicaid Other | Attending: Emergency Medicine | Admitting: Emergency Medicine

## 2015-12-11 DIAGNOSIS — S29001A Unspecified injury of muscle and tendon of front wall of thorax, initial encounter: Secondary | ICD-10-CM | POA: Insufficient documentation

## 2015-12-11 DIAGNOSIS — R55 Syncope and collapse: Secondary | ICD-10-CM | POA: Insufficient documentation

## 2015-12-11 DIAGNOSIS — Y92410 Unspecified street and highway as the place of occurrence of the external cause: Secondary | ICD-10-CM | POA: Insufficient documentation

## 2015-12-11 DIAGNOSIS — S199XXA Unspecified injury of neck, initial encounter: Secondary | ICD-10-CM | POA: Diagnosis present

## 2015-12-11 DIAGNOSIS — Z791 Long term (current) use of non-steroidal anti-inflammatories (NSAID): Secondary | ICD-10-CM | POA: Insufficient documentation

## 2015-12-11 DIAGNOSIS — Y9389 Activity, other specified: Secondary | ICD-10-CM | POA: Insufficient documentation

## 2015-12-11 DIAGNOSIS — T07XXXA Unspecified multiple injuries, initial encounter: Secondary | ICD-10-CM

## 2015-12-11 DIAGNOSIS — Z79899 Other long term (current) drug therapy: Secondary | ICD-10-CM | POA: Insufficient documentation

## 2015-12-11 DIAGNOSIS — Y998 Other external cause status: Secondary | ICD-10-CM | POA: Diagnosis not present

## 2015-12-11 DIAGNOSIS — S161XXA Strain of muscle, fascia and tendon at neck level, initial encounter: Secondary | ICD-10-CM | POA: Diagnosis not present

## 2015-12-11 DIAGNOSIS — S0990XA Unspecified injury of head, initial encounter: Secondary | ICD-10-CM | POA: Diagnosis not present

## 2015-12-11 DIAGNOSIS — S3992XA Unspecified injury of lower back, initial encounter: Secondary | ICD-10-CM | POA: Insufficient documentation

## 2015-12-11 MED ORDER — TRAMADOL HCL 50 MG PO TABS: 50.0000 mg | ORAL_TABLET | Freq: Four times a day (QID) | ORAL | Status: DC | PRN

## 2015-12-11 MED ORDER — METHOCARBAMOL 500 MG PO TABS: 500.0000 mg | ORAL_TABLET | Freq: Two times a day (BID) | ORAL | Status: DC

## 2015-12-11 MED ORDER — ACETAMINOPHEN 500 MG PO TABS
1000.0000 mg | ORAL_TABLET | Freq: Once | ORAL | Status: AC
  Administered 2015-12-11: 1000 mg via ORAL
  Filled 2015-12-11: qty 2

## 2015-12-11 NOTE — ED Notes (Signed)
MD at bedside. 
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
## 2015-12-11 NOTE — ED Notes (Signed)
c-collar removed per [facility] from Dr. Fredderick PhenixBelfi
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
## 2015-12-11 NOTE — ED Notes (Signed)
Rx x 2 given for tramadol and robaxin. Work note and D/c Facilities managerresource guide given. Ice pack provided for home use

## 2015-12-19 ENCOUNTER — Ambulatory Visit: Payer: Self-pay | Admitting: Family Medicine

## 2015-12-26 ENCOUNTER — Ambulatory Visit (INDEPENDENT_AMBULATORY_CARE_PROVIDER_SITE_OTHER): Payer: Medicaid Other | Admitting: Family Medicine

## 2015-12-26 ENCOUNTER — Ambulatory Visit (HOSPITAL_BASED_OUTPATIENT_CLINIC_OR_DEPARTMENT_OTHER)
Admission: RE | Admit: 2015-12-26 | Discharge: 2015-12-26 | Disposition: A | Discharge status: Home / Self Care | Payer: Medicaid Other | Source: Ambulatory Visit | Attending: Family Medicine | Admitting: Family Medicine

## 2015-12-26 VITALS — Ht 70.0 in | Wt 220.0 lb

## 2015-12-26 DIAGNOSIS — M25561 Pain in right knee: Secondary | ICD-10-CM

## 2015-12-26 DIAGNOSIS — S8991XA Unspecified injury of right lower leg, initial encounter: Secondary | ICD-10-CM | POA: Diagnosis not present

## 2015-12-26 DIAGNOSIS — S20212A Contusion of left front wall of thorax, initial encounter: Secondary | ICD-10-CM | POA: Diagnosis not present

## 2015-12-26 DIAGNOSIS — S161XXA Strain of muscle, fascia and tendon at neck level, initial encounter: Secondary | ICD-10-CM | POA: Diagnosis not present

## 2015-12-26 DIAGNOSIS — S39012A Strain of muscle, fascia and tendon of lower back, initial encounter: Secondary | ICD-10-CM

## 2015-12-26 MED ORDER — METHOCARBAMOL 500 MG PO TABS: 500.0000 mg | ORAL_TABLET | Freq: Three times a day (TID) | ORAL | Status: DC | PRN

## 2015-12-26 MED ORDER — TRAMADOL HCL 50 MG PO TABS: 50.0000 mg | ORAL_TABLET | Freq: Four times a day (QID) | ORAL | Status: DC | PRN

## 2015-12-26 MED ORDER — PREDNISONE 10 MG PO TABS: ORAL_TABLET | ORAL | Status: DC
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
## 2015-12-27 ENCOUNTER — Encounter: Payer: Self-pay | Admitting: Family Medicine

## 2015-12-30 DIAGNOSIS — S161XXA Strain of muscle, fascia and tendon at neck level, initial encounter: Secondary | ICD-10-CM | POA: Insufficient documentation

## 2015-12-30 DIAGNOSIS — S8991XA Unspecified injury of right lower leg, initial encounter: Secondary | ICD-10-CM | POA: Insufficient documentation

## 2015-12-30 DIAGNOSIS — S39012A Strain of muscle, fascia and tendon of lower back, initial encounter: Secondary | ICD-10-CM | POA: Insufficient documentation

## 2015-12-30 DIAGNOSIS — S20219A Contusion of unspecified front wall of thorax, initial encounter: Secondary | ICD-10-CM | POA: Insufficient documentation
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
## 2015-12-30 NOTE — Assessment & Plan Note (Signed)
CT scan with only DDD.  Plan to start physical therapy - he is working with [employer] and will call us when this is sorted out to start PT.  Prednisone dose pack with robaxin and tramadol as needed.  Heat for spasms.

## 2015-12-30 NOTE — Assessment & Plan Note (Signed)
Right knee contusion - less likely meniscus tear.  Reassured.
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
## 2016-01-26 ENCOUNTER — Encounter (INDEPENDENT_AMBULATORY_CARE_PROVIDER_SITE_OTHER): Payer: Self-pay

## 2016-01-26 ENCOUNTER — Encounter: Payer: Self-pay | Admitting: Family Medicine

## 2016-01-26 ENCOUNTER — Ambulatory Visit (INDEPENDENT_AMBULATORY_CARE_PROVIDER_SITE_OTHER): Payer: Medicaid Other | Admitting: Family Medicine

## 2016-01-26 VITALS — BP 132/87 | HR 91 | Ht 70.0 in | Wt 220.0 lb

## 2016-01-26 DIAGNOSIS — S39012D Strain of muscle, fascia and tendon of lower back, subsequent encounter: Secondary | ICD-10-CM

## 2016-01-26 DIAGNOSIS — M25512 Pain in left shoulder: Secondary | ICD-10-CM

## 2016-01-26 DIAGNOSIS — S161XXD Strain of muscle, fascia and tendon at neck level, subsequent encounter: Secondary | ICD-10-CM | POA: Diagnosis present

## 2016-01-26 DIAGNOSIS — S8991XD Unspecified injury of right lower leg, subsequent encounter: Secondary | ICD-10-CM

## 2016-01-26 MED ORDER — TRAMADOL HCL 50 MG PO TABS: 50.0000 mg | ORAL_TABLET | Freq: Four times a day (QID) | ORAL | Status: DC | PRN

## 2016-01-26 NOTE — Patient Instructions (Signed)
You have cervical, lumbar strains though we're worried about a possible disc bulge on left side of low back. Do your visit of physical therapy for this. Do home exercises on days you don't go to therapy. You also have scapulothoracic bursitis. Do home exercises 3 sets of 10 once a day for this. Aleve 2 tabs twice a day with food for pain and inflammation. Robaxin three times a day as needed for muscle spasms (can make you sleepy - if so do not drive while taking this). Tramadol as needed for severe pain (no driving on this medicine). Simple range of motion exercises within limits of pain to prevent further stiffness. Heat 15 minutes at a time 3-4 times a day to help with spasms. Watch head position when on computers, texting, when sleeping in bed - should in line with back to prevent further spasms. Consider home traction unit if you get benefit with this in physical therapy. If not improving we will consider MRIs Follow up with me in 1 month.

## 2016-01-30 DIAGNOSIS — M25512 Pain in left shoulder: Secondary | ICD-10-CM | POA: Insufficient documentation

## 2016-01-30 NOTE — Assessment & Plan Note (Signed)
exam suggests scapulothoracic bursitis.  Will do physical therapy, shown home exercise program to do daily.  F/u in 1 month for reevaluation of all issues.
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
## 2016-01-30 NOTE — Assessment & Plan Note (Signed)
Right knee contusion - less likely meniscus tear given exam, mechanism of injury.  Reassured.

## 2016-01-30 NOTE — Assessment & Plan Note (Signed)
CT scan with only DDD.  Some numbness only in 5th digit though and some weakness this visit with finger abduction, thumb opposition - though ? Decreased effort or pain limiting him.  Will try physical therapy though only gets a single visit.  S/p prednisone, robaxin, tramadol.  F/u in 1 month.  Consider MRI if not improving. Heat as needed.  Discussed ergonomic issues.

## 2016-01-30 NOTE — Assessment & Plan Note (Signed)
with radiation into left thigh.  Physical therapy.  S/p prednisone, robaxin, tramadol.  Consider MRI if still not improving at follow up in 1 month.

## 2016-02-14 ENCOUNTER — Ambulatory Visit: Payer: Medicaid Other | Attending: Family Medicine | Admitting: Physical Therapy

## 2016-02-14 DIAGNOSIS — M542 Cervicalgia: Secondary | ICD-10-CM | POA: Diagnosis not present

## 2016-02-14 DIAGNOSIS — M25512 Pain in left shoulder: Secondary | ICD-10-CM | POA: Diagnosis present
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
## 2016-02-16 ENCOUNTER — Ambulatory Visit: Payer: Medicaid Other | Admitting: Physical Therapy

## 2016-02-16 DIAGNOSIS — M542 Cervicalgia: Secondary | ICD-10-CM

## 2016-02-16 DIAGNOSIS — M25512 Pain in left shoulder: Secondary | ICD-10-CM

## 2016-02-16 NOTE — Therapy (Signed)
[facility] [address]  [address] [place], [place], [address] Phone: [phone]   Fax:  [phone]  Physical Therapy Treatment  Patient Details  Name: [name] Moore MRN: [id number] Date of Birth: Jan 25, 1971 Referring Provider: Norton BlizzardHudnall, [name]  Encounter Date: 02/16/2016      PT End of Session - 02/16/16 1724    Visit Number 2   Number of Visits 12   Date for PT Re-Evaluation 03/27/16   PT Start Time 1711  pt arrived late   PT Stop Time 1754   PT Time Calculation (min) 43 min      No past medical history on file.  No past surgical history on file.  There were no vitals filed for this visit.      Subjective Assessment - 02/16/16 1720    Subjective Pt reports neck/back/knee pain essentially unchanged from initial eval but noting some numbness in anterior L thigh which comes and goes. Pt noting benefit from estim & MHP at last visit.   Patient Stated Goals lessen pain   Currently in Pain? Yes   Pain Score --  5-6/10   Pain Location Neck   Pain Orientation Left   Pain Score --  5-6/10   Pain Location Back   Pain Orientation Lower;Left   Pain Score --  5-6/10   Pain Location Knee   Pain Orientation Right;Medial           TODAY'S TREATMENT  IFC (80-150Hz ) with MHP to L Upper scapula and lower c-spine with pt R side-lying x 15' to help decrease pain and guarding   Manual  pt in R side-lying -    Gentle scapular mobs and gentle STM to L upper scapular muscles with limited tolerance   Multiple TPR to L levator/rhomboid, some with incomplete muscle relaxation but pt noting less pain Grade 1-2 posterior-lateral 2nd anterior rib mobs   TherEx Attempted posterior shoulder rolls to promote relaxation but pt unable to recreate movement on L B gentle scapular retraction x10  Postural education for neutral cervical spine and shoulders           PT Short Term Goals - 02/16/16 1724    PT SHORT TERM  GOAL #1   Title Assess LBP and establish POC and LTG as appropriate by 02/28/16   Status On-going           PT Long Term Goals - 02/16/16 1725    PT LONG TERM GOAL #1   Title independent with HEP as necessary for continued progression / symptom management by 03/27/16   Status On-going   PT LONG TERM GOAL #2   Title c-spine AROM WFL without c/o pain during assessment to allow safe performance of ADLs and driving by 6/96/296/20/17   Status On-going   PT LONG TERM GOAL #3   Title pt reports neck pain no greater than 2/10 on AVG and denies difficulty sleeping associated with neck pain by 03/27/16   Status On-going   PT LONG TERM GOAL #4   Title L Shoulder AROM WFL without limitation by pain to allow performance of ADLs, chores, and work duties without restriction by 03/27/16   Status On-going               Plan - 02/16/16 1733    Clinical Impression Statement Pt reporting pain unchanged since eval but did note positive response to IFC + MHP during eval visit, therefore treatment initiated with this. Pt tolerated gentle scapular  mobs and STM/TPR to upper scapular muscles with decreased pain reported but incomplete release of all TP's. Pt noted to have apparent anterior rotation of L 2nd rib but only tolerated Grade 1-2 mobs and unable to achieve neutral alignment. Provided postural education and encouraged gentle scapular retraction to correct posture and reduce strain on posterior scapular muscles with pt demonstrating improved symmetircal alignment of shoulders upon completion of exercise.   PT Next Visit Plan IFC with MHP to L neck followed by manual to this area; then AAROM / AROM as able.  May use ionto and/or dry needling to this area to assist with pain reduction if MD agrees.      Patient will benefit from skilled therapeutic intervention in order to improve the following deficits and impairments:  Pain, Decreased strength, Decreased mobility, Increased edema, Postural dysfunction,  Impaired UE functional use, Increased muscle spasms, Decreased range of motion  Visit Diagnosis: Cervicalgia  Pain in left shoulder     Problem List Patient Active Problem List   Diagnosis Date Noted  . Left shoulder pain 01/30/2016  . Cervical strain 12/30/2015  . Lumbar strain 12/30/2015  . Right knee injury 12/30/2015  . Chest wall contusion 12/30/2015    Marry Guan, PT, MPT 02/16/2016, 6:25 PM  [facility] [address]  [address] [place], [place], [address] Phone: [phone]   Fax:  [phone]  Name: [name] Moore MRN: [id number] Date of Birth: [birth year]

## 2016-02-27 ENCOUNTER — Ambulatory Visit (INDEPENDENT_AMBULATORY_CARE_PROVIDER_SITE_OTHER): Payer: Medicaid Other | Admitting: Family Medicine

## 2016-02-27 VITALS — BP 122/80 | HR 74 | Ht 70.0 in | Wt 220.0 lb

## 2016-02-27 DIAGNOSIS — S8991XD Unspecified injury of right lower leg, subsequent encounter: Secondary | ICD-10-CM

## 2016-02-27 DIAGNOSIS — S39012D Strain of muscle, fascia and tendon of lower back, subsequent encounter: Secondary | ICD-10-CM

## 2016-02-27 DIAGNOSIS — S161XXD Strain of muscle, fascia and tendon at neck level, subsequent encounter: Secondary | ICD-10-CM | POA: Diagnosis present
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
## 2016-02-28 ENCOUNTER — Ambulatory Visit: Payer: Medicaid Other | Admitting: Physical Therapy

## 2016-02-28 DIAGNOSIS — M25512 Pain in left shoulder: Secondary | ICD-10-CM

## 2016-02-28 DIAGNOSIS — M542 Cervicalgia: Secondary | ICD-10-CM | POA: Diagnosis not present

## 2016-02-28 NOTE — Therapy (Signed)
[facility] [address]  [address] [place], [place], [address] Phone: [phone]   Fax:  [phone]  Physical Therapy Treatment  Patient Details  Name: Moore Moore MRN: [id number] Date of Birth: 1971/03/30 Referring Provider: Norton BlizzardHudnall, Moore  Encounter Date: 02/28/2016      PT End of Session - 02/28/16 1625    Visit Number 3   Number of Visits 12   Date for PT Re-Evaluation 03/27/16   PT Start Time 1624   PT Stop Time 1712   PT Time Calculation (min) 48 min      No past medical history on file.  No past surgical history on file.  There were no vitals filed for this visit.      Subjective Assessment - 02/28/16 1625    Subjective State feels as though neck is moving better but still with Left upper scapular pain with c-spine AROM.  He saw MD yesterday and is being sent for MRI to c-spine and l-spine.   Currently in Pain? Yes   Pain Score --  5-6/10   Pain Location Neck   Pain Orientation Left            [facility] PT Assessment - 02/28/16 0001    AROM   Cervical Extension 33   Cervical - Right Side Bend 18   Cervical - Left Side Bend 19   Cervical - Right Rotation 47   Cervical - Left Rotation 48         TODAY'S TREATMENT C-spine AROM assessment (no significant changes other than 10 degree improvement into extension and 6 degree worsening into Left side-bending)  Manual - Pt prone STM into L medial upper scapular mms (still with large nodule of increased density and pain noted in this area)  Combo - US 20%, 5cm, 0.5W/cm2, 1.0MHz with Premod E-stim (10-20Hz ) x 10' to nodule in L upper scapular mms.  TherEx - R Side-Lying L Shoulder ABD AROM with elbow bent 90, 0-90dg 10x R Side-Lying L Shoulder ER AROM 10x R Side-Lying L Shoulder AROM AROM 5x with elbow extended R Side-Ling L scapular rolls 10x              PT Education - 02/28/16 1802    Education provided Yes   Education  Details HEP for gentle shoulder AROM to reduce guarding/pain   Person(s) Educated Patient   Methods Explanation;Demonstration   Comprehension Verbalized understanding;Returned demonstration          PT Short Term Goals - 02/16/16 1724    PT SHORT TERM GOAL #1   Title Assess LBP and establish POC and LTG as appropriate by 02/28/16   Status On-going           PT Long Term Goals - 02/16/16 1725    PT LONG TERM GOAL #1   Title independent with HEP as necessary for continued progression / symptom management by 03/27/16   Status On-going   PT LONG TERM GOAL #2   Title c-spine AROM WFL without c/o pain during assessment to allow safe performance of ADLs and driving by 1/32/446/20/17   Status On-going   PT LONG TERM GOAL #3   Title pt reports neck pain no greater than 2/10 on AVG and denies difficulty sleeping associated with neck pain by 03/27/16   Status On-going   PT LONG TERM GOAL #4   Title L Shoulder AROM WFL without limitation by pain to allow performance of ADLs, chores, and work duties  without restriction by 03/27/16   Status On-going               Plan - 02/28/16 1803    Clinical Impression Statement no change to date.  He states his neck feels as though is moving better but AROM assessment finds basically no change.  There is still a large high density painful nodule in upper medial border of Left scapula.  This is his area of chief pain.  Performed combo [place] with e-stim to the area along with some soft tissue manual work.  Added some AROM scapular and shoulder exercises to POC and HEP to see if can start normalizing his shoulder/scapular mechanics.  He is being sent for MRI to his neck, lower back, and R knee - all of which have not yet been scheduled.   PT Next Visit Plan IFC with MHP to L neck/upper scapula and/or [place] combo for pain/relaxation; manual to this area for pain and mobility to c-spine and L scapula; AAROM / AROM as able.  May use ionto and/or dry needling to this area  to assist with pain reduction if MD agrees.   Consulted and Agree with Plan of Care Patient      Patient will benefit from skilled therapeutic intervention in order to improve the following deficits and impairments:  Pain, Decreased strength, Decreased mobility, Increased edema, Postural dysfunction, Impaired UE functional use, Increased muscle spasms, Decreased range of motion  Visit Diagnosis: Cervicalgia  Pain in left shoulder     Problem List Patient Active Problem List   Diagnosis Date Noted  . Left shoulder pain 01/30/2016  . Cervical strain 12/30/2015  . Lumbar strain 12/30/2015  . Right knee injury 12/30/2015  . Chest wall contusion 12/30/2015    [facility] PT, OCS 02/28/2016, 6:08 PM  [facility] [address]  [address] [place], [place], [address] Phone: [phone]   Fax:  [phone]  Name: Moore Moore MRN: [id number] Date of Birth: [birth year]

## 2016-02-29 ENCOUNTER — Encounter: Payer: Self-pay | Admitting: Family Medicine
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
## 2016-02-29 NOTE — Assessment & Plan Note (Signed)
with radiation into left thigh.  Doing physical therapy.  S/p prednisone, robaxin, tramadol.  MRI to assess for disc herniation.

## 2016-02-29 NOTE — Assessment & Plan Note (Signed)
CT scan with only DDD.  Some numbness only in 5th digit though and some weakness this visit with finger abduction, thumb opposition.  Has been doing physical therapy and home exercises.  S/p prednisone, robaxin, tramadol.  Will go ahead with MRI to assess for disc herniation.

## 2016-02-29 NOTE — Assessment & Plan Note (Signed)
less likely meniscus tear given exam, mechanism of injury.  Not improving with conservative measures so will assess for

## 2016-03-01 ENCOUNTER — Ambulatory Visit: Payer: Medicaid Other | Admitting: Physical Therapy

## 2016-03-01 DIAGNOSIS — M542 Cervicalgia: Secondary | ICD-10-CM

## 2016-03-01 DIAGNOSIS — M25512 Pain in left shoulder: Secondary | ICD-10-CM

## 2016-03-01 NOTE — Therapy (Signed)
[facility] [address]  [address] [place], [place], [address] Phone: [phone]   Fax:  [phone]  Physical Therapy Treatment  Patient Details  Name: [name] Moore MRN: [id number] Date of Birth: 1971-09-24 Referring Provider: Norton Blizzard  Encounter Date: 03/01/2016      PT End of Session - 03/01/16 1626    Visit Number 4   Number of Visits 12   Date for PT Re-Evaluation 03/27/16   PT Start Time 1625      No past medical history on file.  No past surgical history on file.  There were no vitals filed for this visit.      Subjective Assessment - 03/01/16 1626    Subjective pt states noted headache following last treatment which lasted until next AM.  States pain onset noted after he got home and was sitting at computer. States pain noted throughout L side of head and into L scapula.   Currently in Pain? Yes   Pain Score 5    Pain Location Neck   Pain Orientation Left          TODAY'S TREATMENT IFC (80-150Hz ) to Left upper scapular mms and lower Left c-spine x 15'  Manual - STM and gentle stretching L UT, L LS, and into suboccip area.  He notes pain extending into scalp with anything greater than light STM into suboccip area.  Ionto - #1/6 with 6 hour patch with 1.18ml Dex at 80mA.min to L upper scap mms                         PT Short Term Goals - 02/16/16 1724    PT SHORT TERM GOAL #1   Title Assess LBP and establish POC and LTG as appropriate by 02/28/16   Status On-going           PT Long Term Goals - 02/16/16 1725    PT LONG TERM GOAL #1   Title independent with HEP as necessary for continued progression / symptom management by 03/27/16   Status On-going   PT LONG TERM GOAL #2   Title c-spine AROM WFL without c/o pain during assessment to allow safe performance of ADLs and driving by 9/52/84   Status On-going   PT LONG TERM GOAL #3   Title pt reports neck pain no  greater than 2/10 on AVG and denies difficulty sleeping associated with neck pain by 03/27/16   Status On-going   PT LONG TERM GOAL #4   Title L Shoulder AROM WFL without limitation by pain to allow performance of ADLs, chores, and work duties without restriction by 03/27/16   Status On-going               Plan - 03/01/16 1637    Clinical Impression Statement c/o headache following last treatment; however, states onset of headache was noted while sitting at computer so may have been related to computer work rather than treatment.  He states he has performed HEP for shoulder ROM since last treatment 2 days ago and he states went well without noting increased pain.  Today's treatment initiated with IFC to L upper scapular mms.  Pt did not want heat today due to feeling this may increase his pain.  Large high density nodule still present in Left upper medial scapular mms.   PT Next Visit Plan IFC to L neck/upper scapula and/or [place] combo for pain/relaxation; manual to this area  for pain and mobility to c-spine and L scapula; AAROM / AROM as able.  May use ionto and/or dry needling to this area to assist with pain reduction if MD agrees.   Consulted and Agree with Plan of Care Patient      Patient will benefit from skilled therapeutic intervention in order to improve the following deficits and impairments:  Pain, Decreased strength, Decreased mobility, Increased edema, Postural dysfunction, Impaired UE functional use, Increased muscle spasms, Decreased range of motion  Visit Diagnosis: Cervicalgia  Pain in left shoulder     Problem List Patient Active Problem List   Diagnosis Date Noted  . Left shoulder pain 01/30/2016  . Cervical strain 12/30/2015  . Lumbar strain 12/30/2015  . Right knee injury 12/30/2015  . Chest wall contusion 12/30/2015    [facility],Disha Cottam PT, OCS 03/01/2016, 4:45 PM  [facility] [address]  [address] [place], [place], [address] Phone: [phone]   Fax:  [phone]  Name: [name] Moore MRN: [id number] Date of Birth: 09-16-1971

## 2016-03-06 ENCOUNTER — Ambulatory Visit: Payer: Medicaid Other | Admitting: Physical Therapy

## 2016-03-06 DIAGNOSIS — M542 Cervicalgia: Secondary | ICD-10-CM

## 2016-03-06 DIAGNOSIS — M25512 Pain in left shoulder: Secondary | ICD-10-CM

## 2016-03-06 NOTE — Patient Instructions (Signed)
TENS UNIT: This is helpful for muscle pain and spasm.   Search and Purchase a TENS 7000 2nd edition at www.tenspros.com. It should be less than $30.     TENS unit instructions: Do not shower or bathe with the unit on  Turn the unit off before removing electrodes or batteries  If the electrodes lose stickiness add a drop of water to the electrodes after they are disconnected from the unit and place on plastic sheet. If you continued to have difficulty, call the TENS unit company to purchase more electrodes.  Do not apply lotion on the skin area prior to use. Make sure the skin is clean and dry as this will help prolong the life of the electrodes.  After use, always check skin for unusual red areas, rash or other skin difficulties. If there are any skin problems, does not apply electrodes to the same area.  Never remove the electrodes from the unit by pulling the wires.  Do not use the TENS unit or electrodes other than as directed.  Do not change electrode placement without consulting your therapist or physician.  Keep 2 fingers with between each electrode. 

## 2016-03-06 NOTE — Therapy (Signed)
[facility] [address]  [address] [place], [place], [address] Phone: [phone]   Fax:  [phone]  Physical Therapy Treatment  Patient Details  Name: [name] Moore MRN: [id number] Date of Birth: Nov 09, 1970 Referring Provider: Norton Moore  Encounter Date: 03/06/2016      PT End of Session - 03/06/16 1634    Visit Number 5   Number of Visits 12   Date for PT Re-Evaluation 03/27/16   PT Start Time 1618   PT Stop Time 1704   PT Time Calculation (min) 46 min   Activity Tolerance Patient limited by pain   Behavior During Therapy [facility] for tasks assessed/performed      No past medical history on file.  No past surgical history on file.  There were no vitals filed for this visit.      Subjective Assessment - 03/06/16 1627    Subjective Pt reports pain essentially unchanged from last visit, with pt not noting any significant change from initial trial of ionto patch. Currently controlling pain with Tramadol and Voltaren gel to knee.   Patient Stated Goals lessen pain   Currently in Pain? Yes   Pain Score 5    Pain Location Neck   Pain Orientation Left   Pain Score 5   Pain Location Back   Pain Radiating Towards numbness into L lateral thigh   Pain Score 7   Pain Location Knee   Pain Orientation Right           TODAY'S TREATMENT  IFC (80-150Hz ) to Left upper scapular mms and lower Left c-spine x 15'  Manual -  Pt prone STM into L medial upper scapular mms (still with large nodule of increased density and pain noted in this area) STM and gentle stretching L UT, L LS, and into suboccip area. Increased pain with L LS stretch, especially over large nodule, but also extending into scalp behind L ear. Pt prone STM into L medial upper scapular mms (still with large nodule of increased density and pain noted in this area)  TherEx - Seated rhomboid stretch x20" Seated gentle L LS stretch 2x20" - Pt  instructed only to move into slight stretch but to avoid increased pain.  Ionto - #2/6 with 6 hour patch with 1.37ml Dex at 80mA.min to L upper scap mms           PT Education - 03/06/16 1837    Education provided Yes   Education Details Pt provided info regarding home TENS unit   Person(s) Educated Patient   Methods Explanation;Handout   Comprehension Verbalized understanding          PT Short Term Goals - 02/16/16 1724    PT SHORT TERM GOAL #1   Title Assess LBP and establish POC and LTG as appropriate by 02/28/16   Status On-going           PT Long Term Goals - 03/06/16 1852    PT LONG TERM GOAL #1   Title independent with HEP as necessary for continued progression / symptom management by 03/27/16   Status On-going   PT LONG TERM GOAL #2   Title c-spine AROM WFL without c/o pain during assessment to allow safe performance of ADLs and driving by 6/96/29   Status On-going   PT LONG TERM GOAL #3   Title pt reports neck pain no greater than 2/10 on AVG and denies difficulty sleeping associated with neck pain by 03/27/16  Status On-going   PT LONG TERM GOAL #4   Title L Shoulder AROM WFL without limitation by pain to allow performance of ADLs, chores, and work duties without restriction by 03/27/16   Status On-going               Plan - 03/06/16 1838    Clinical Impression Statement Pt continues to report neck and low back pain consistently 5/10 with little change noted. Minimal to no response from initial ionto patch but pt wanting to try it again today. Conitnued limited tolerance for STM, stretching and PROM with persistent large high density nodule still present in L upper medial scapular muscles. Today's treatment initiated with IFC to L upper scapular mms and L neck (w/o heat per pt preference) followed by manual soft tissue work throughout L neck and very gentle PROM/stretching. Small cavitation with brief sharp pain noted with PROM in L cervical rotation but  pain did last. MRI to lower back, neck, and R knee still pending. Treatment focus remains on neck per pt initial area of greatest concern, but will address low back and R knee per pt preference +/- reduction in neck pain.       PT Next Visit Plan IFC to L neck/upper scapula and/or US combo for pain/relaxation; manual to this area for pain and mobility to c-spine and L scapula; AAROM / AROM as able. Ionto #3 of 6 as indicated.   Consulted and Agree with Plan of Care Patient      Patient will benefit from skilled therapeutic intervention in order to improve the following deficits and impairments:  Pain, Decreased strength, Decreased mobility, Increased edema, Postural dysfunction, Impaired UE functional use, Increased muscle spasms, Decreased range of motion  Visit Diagnosis: Cervicalgia  Pain in left shoulder     Problem List Patient Active Problem List   Diagnosis Date Noted  . Left shoulder pain 01/30/2016  . Cervical strain 12/30/2015  . Lumbar strain 12/30/2015  . Right knee injury 12/30/2015  . Chest wall contusion 12/30/2015    [name], PT, MPT 03/06/2016, 6:54 PM  [facility] [address]  [address] [place], [place], [address] Phone: [phone]   Fax:  [phone]  Name: [name] Moore MRN: [id number] Date of Birth: 1971-03-24

## 2016-03-08 ENCOUNTER — Ambulatory Visit: Payer: Medicaid Other | Attending: Family Medicine | Admitting: Physical Therapy

## 2016-03-08 DIAGNOSIS — M25512 Pain in left shoulder: Secondary | ICD-10-CM | POA: Insufficient documentation

## 2016-03-08 DIAGNOSIS — M25561 Pain in right knee: Secondary | ICD-10-CM | POA: Diagnosis present

## 2016-03-08 DIAGNOSIS — M5442 Lumbago with sciatica, left side: Secondary | ICD-10-CM | POA: Diagnosis present

## 2016-03-08 DIAGNOSIS — M542 Cervicalgia: Secondary | ICD-10-CM | POA: Diagnosis not present

## 2016-03-08 NOTE — Therapy (Signed)
[facility] [address]  [address] [place], [place], [address] Phone: [phone]   Fax:  [phone]  Physical Therapy Treatment  Patient Details  Name: [name] Moore MRN: [id number] Date of Birth: 1971-09-19 Referring Provider: Norton BlizzardHudnall, [name]  Encounter Date: 03/08/2016      PT End of Session - 03/08/16 1718    Visit Number 5   Number of Visits 12   Date for PT Re-Evaluation 03/27/16   PT Start Time 1616   PT Stop Time 1719   PT Time Calculation (min) 63 min   Activity Tolerance Patient limited by pain   Behavior During Therapy [facility] for tasks assessed/performed      No past medical history on file.  No past surgical history on file.  There were no vitals filed for this visit.      Subjective Assessment - 03/08/16 1621    Subjective Pt feels like nodule on L upper medial scapular border may be getting smaller with ionto patch. Pt reports completing shoulder ROM exercises 3x/day but feels that pain is more irritated afterwards. Able to get a good stretch with L LS stretch without increased pain, but states pain flares as stretch released.   Patient Stated Goals lessen pain   Currently in Pain? Yes   Pain Score 5    Pain Location Neck   Pain Orientation Left          TODAY'S TREATMENT  Manual   STM and gentle stretching L UT, L LS, and into suboccip area. Continued increased pain with L LS stretch, especially over large nodule, but also extending into scalp behind L ear. Grade 1-2 posterior-lateral 2nd anterior rib mobs.  Limited tolerance due to increased pain. Scapular mobs in R sidelying  TherEx R Side-lying L Shoulder ER AROM x10 (pt corrected to maintain neutral shoulder with upper arm resting on side) R Side-lying L Shoulder Horiz ABD to vertical x5 (deferred d/t increasing pain) R Side-lying L Scapular retraction x10   Modalities IFC (80-150Hz ) to Left upper scapular mms and lower Left c-spine  x 15' Vasopnuematic compression to L shoulder with ice pack extension over lower cervical spine - Low compression, Coldest temp x15'           PT Short Term Goals - 02/16/16 1724    PT SHORT TERM GOAL #1   Title Assess LBP and establish POC and LTG as appropriate by 02/28/16   Status On-going           PT Long Term Goals - 03/06/16 1852    PT LONG TERM GOAL #1   Title independent with HEP as necessary for continued progression / symptom management by 03/27/16   Status On-going   PT LONG TERM GOAL #2   Title c-spine AROM WFL without c/o pain during assessment to allow safe performance of ADLs and driving by 3/01/606/20/17   Status On-going   PT LONG TERM GOAL #3   Title pt reports neck pain no greater than 2/10 on AVG and denies difficulty sleeping associated with neck pain by 03/27/16   Status On-going   PT LONG TERM GOAL #4   Title L Shoulder AROM WFL without limitation by pain to allow performance of ADLs, chores, and work duties without restriction by 03/27/16   Status On-going               Plan - 03/08/16 1824    Clinical Impression Statement Pt noting some perceived improvement in  size of L upper medial scapular muscle nodule after second dose of ionto but not significant change in pain. Reporting increased pain after completing HEP 3x/day therefore reviewed HEP with corrections to ensure proper technique and instructed pt to cut back to 1x/day and notify PT if pain continues to increase. Switched IFC to end of treatment and applied in conjuction with ice/vasopnuematic compression in effort to reduce post-therapy irritation and will f/u to assess response at next visit.   PT Next Visit Plan IFC to L neck/upper scapula and/or [place] combo for pain/relaxation; manual to this area for pain and mobility to c-spine and L scapula; AAROM / AROM as able. Ionto #3 of 6 as indicated.   Consulted and Agree with Plan of Care Patient      Patient will benefit from skilled therapeutic  intervention in order to improve the following deficits and impairments:  Pain, Decreased strength, Decreased mobility, Increased edema, Postural dysfunction, Impaired UE functional use, Increased muscle spasms, Decreased range of motion  Visit Diagnosis: Cervicalgia  Pain in left shoulder     Problem List Patient Active Problem List   Diagnosis Date Noted  . Left shoulder pain 01/30/2016  . Cervical strain 12/30/2015  . Lumbar strain 12/30/2015  . Right knee injury 12/30/2015  . Chest wall contusion 12/30/2015    Marry Guan, PT, MPT 03/08/2016, 6:35 PM  [facility] [address]  [address] [place], [place], [address] Phone: [phone]   Fax:  [phone]  Name: [name] Moore MRN: [id number] Date of Birth: Nov 01, 1970

## 2016-03-15 NOTE — Addendum Note (Signed)
Addended by: Kathi SimpersWISE, Zuriah Bordas F on: 03/15/2016 11:15 AM   Modules accepted: Orders

## 2016-03-19 ENCOUNTER — Ambulatory Visit: Payer: Medicaid Other | Admitting: Physical Therapy

## 2016-03-19 DIAGNOSIS — M542 Cervicalgia: Secondary | ICD-10-CM

## 2016-03-19 DIAGNOSIS — M5442 Lumbago with sciatica, left side: Secondary | ICD-10-CM

## 2016-03-19 DIAGNOSIS — M25561 Pain in right knee: Secondary | ICD-10-CM

## 2016-03-19 DIAGNOSIS — M25512 Pain in left shoulder: Secondary | ICD-10-CM

## 2016-03-19 NOTE — Therapy (Signed)
[facility] [address]  [address] [place], [place], [address] Phone: [phone]   Fax:  [phone]  Physical Therapy Treatment  Patient Details  Name: [name] Moore MRN: [id number] Date of Birth: 06-Oct-1971 Referring Provider: Norton Blizzard, MD  Encounter Date: 03/19/2016      PT End of Session - 03/19/16 1647    Visit Number 6   Number of Visits 12   Date for PT Re-Evaluation 03/27/16   PT Start Time 1641   PT Stop Time 1740   PT Time Calculation (min) 59 min   Activity Tolerance Patient limited by pain   Behavior During Therapy [facility] for tasks assessed/performed;Anxious      No past medical history on file.  No past surgical history on file.  There were no vitals filed for this visit.      Subjective Assessment - 03/19/16 1643    Subjective Pt reports MRIs scheduled for 03/24/16 with f/u with Dr. Pearletha Forge on 03/26/16. No change in neck pain since last visit but feels R knee is getting worse.   Patient Stated Goals lessen pain   Currently in Pain? Yes   Pain Score 6    Pain Location Neck   Pain Orientation Left   Pain Score 6   Pain Location Back   Pain Orientation Lower;Left   Pain Radiating Towards numbness into L lateral thigh and down to foot at times   Pain Score --  7-8/10   Pain Location Knee   Pain Orientation Right;Anterior;Medial   Pain Descriptors / Indicators --  Deep            [facility] PT Assessment - 03/19/16 1641    Assessment   Medical Diagnosis R knee pain, LBP   Referring Provider Norton Blizzard, MD   Onset Date/Surgical Date 12/11/15   Hand Dominance Left   Next MD Visit 03/26/16   ROM / Strength   AROM / PROM / Strength AROM;Strength   AROM   AROM Assessment Site Knee   Right/Left Knee Right;Left   Right Knee Extension 0  -3 supported   Right Knee Flexion 100  limited by pain   Left Knee Extension 0   Left Knee Flexion 138   Strength   Strength Assessment Site Hip;Knee    Right/Left Hip Right;Left   Right Hip Flexion 3+/5  increased back pain   Right Hip Extension 3+/5   Right Hip ABduction 4-/5   Right Hip ADduction 3/5   Left Hip Flexion 4/5  increased back pain   Left Hip Extension 4/5   Left Hip ABduction 4+/5   Left Hip ADduction 4/5   Right/Left Knee Right;Left   Right Knee Flexion 4-/5   Right Knee Extension 4-/5  pain with resistance   Left Knee Flexion 4+/5   Left Knee Extension 4-/5  back pain with resistance   Flexibility   Soft Tissue Assessment /Muscle Length yes   Hamstrings moderate-severe tightness bilaterally   Quadriceps midly tight bilaterally, but moderate RF tightness on R   ITB tight on R   Piriformis tight bilaterally, R > L   Special Tests    Special Tests Lumbar;Hip Special Tests;Meniscus Tests   Lumbar Tests Straight Leg Raise;FABER test   Hip Special Tests  Ober's Test   Meniscus Tests McMurray Test   FABER test   findings Positive   Side LEft   Straight Leg Raise   Findings Positive   Side  Left  Ober's Test   Findings Positive   Side Right   McMurray Test   Findings Unable to test   Comments pt unable to sufficiently relax and no guard during testing          Today's Treatment  Lower body assessment including low back and LE as tolerated by patient (see above)  TherEx B Supine HS stretch with strap 2x30" B SKTC stretch 2x30" LTR 5x10" TrA + Hooklying Hip Adduction ball squeeze 10x5" TrA + Hooklying Alternating Knee Extension 10x5" TrA + Hooklying Alternating Hip ABD/ER with blue TB 10x5" (green TB provided for HEP as pt felt like blue resistance too great)          PT Education - 03/19/16 1750    Education provided Yes   Education Details LBP/knee HEP   Person(s) Educated Patient   Methods Explanation;Demonstration;Handout   Comprehension Verbalized understanding;Returned demonstration;Need further instruction          PT Short Term Goals - 03/19/16 1827    PT SHORT TERM  GOAL #1   Title Assess LBP and establish POC and LTG as appropriate by 02/28/16   Status Achieved           PT Long Term Goals - 03/19/16 1827    PT LONG TERM GOAL #1   Title independent with HEP as necessary for continued progression / symptom management by 03/27/16   Status On-going   PT LONG TERM GOAL #2   Title c-spine AROM WFL without c/o pain during assessment to allow safe performance of ADLs and driving by 9/60/45   Status On-going   PT LONG TERM GOAL #3   Title pt reports neck pain no greater than 2/10 on AVG and denies difficulty sleeping associated with neck pain by 03/27/16   Status On-going   PT LONG TERM GOAL #4   Title L Shoulder AROM WFL without limitation by pain to allow performance of ADLs, chores, and work duties without restriction by 03/27/16   Status On-going   PT LONG TERM GOAL #5   Title R knee AROM to within 10 dg of L without increased pain by 03/27/16   Status New   PT LONG TERM GOAL #6   Title B LE hip and knee strength >/= 4+/5    Status New   PT LONG TERM GOAL #7   Title Lumbar flexibility WFL without c/o pain during assessment    Status New               Plan - 03/19/16 1750    Clinical Impression Statement Pt returning after 11 day absence from PT and reports neck pain is essentially unchanged, but feels that R knee pain is worsening and would like address this. Lower body assessment completed but limited due to significant pt guarding. Pt demonstrates limited R knee ROM 0-100 with flexion limited by anterior-medial knee pain. Tightness noted throughout proximal LE musculature, especially in hamstrings & R RF. Special testing limited by pt guarding but positive for L SLR. Given influence of LBP on knee assessment, initial exercises and stretches geared toward core flexibility and stabilization. Ionto patch #3 applied to R medial knee. MRIs for neck, low back and R knee pending on 03/24/16 with pt to f/u with Dr. Pearletha Forge on 03/26/16. Pending response  to updated POC as well as outcomes of MRIs, may need to consider extending POC.   PT Treatment/Interventions Manual techniques;Therapeutic exercise;Moist Heat;Iontophoresis /ml Dexamethasone;Electrical Stimulation;Cryotherapy;Taping;Vasopneumatic Device;Functional mobility training;Patient/family education;Dry needling;Traction;Ultrasound;ADLs/Self Care Home Management  PT Next Visit Plan Review new HEP for core flexibility & strengthening/stabilization and progress activities per pt tolerance. Ionto #4 of 6 as indicated. Readdress neck per pt request.   Consulted and Agree with Plan of Care Patient      Patient will benefit from skilled therapeutic intervention in order to improve the following deficits and impairments:  Pain, Decreased strength, Decreased mobility, Increased edema, Postural dysfunction, Impaired UE functional use, Increased muscle spasms, Decreased range of motion  Visit Diagnosis: Cervicalgia  Pain in left shoulder  Left-sided low back pain with left-sided sciatica  Pain in right knee     Problem List Patient Active Problem List   Diagnosis Date Noted  . Left shoulder pain 01/30/2016  . Cervical strain 12/30/2015  . Lumbar strain 12/30/2015  . Right knee injury 12/30/2015  . Chest wall contusion 12/30/2015    Marry GuanJoAnne M Kreis, PT, MPT 03/19/2016, 6:40 PM  [facility] [place] [address]  [address] [place], [place], [address] Phone: [phone]   Fax:  [phone]  Name: [name] Moore MRN: [id number] Date of Birth: [birth year]

## 2016-03-20 ENCOUNTER — Ambulatory Visit: Payer: Medicaid Other | Admitting: Physical Therapy

## 2016-03-22 ENCOUNTER — Ambulatory Visit: Payer: Medicaid Other | Admitting: Physical Therapy

## 2016-03-22 DIAGNOSIS — M542 Cervicalgia: Secondary | ICD-10-CM | POA: Diagnosis not present

## 2016-03-22 DIAGNOSIS — M25512 Pain in left shoulder: Secondary | ICD-10-CM

## 2016-03-22 DIAGNOSIS — M5442 Lumbago with sciatica, left side: Secondary | ICD-10-CM

## 2016-03-22 DIAGNOSIS — M25561 Pain in right knee: Secondary | ICD-10-CM

## 2016-03-22 NOTE — Therapy (Signed)
[facility] [address]  [address] [place], [place], [address] Phone: [phone]   Fax:  [phone]  Physical Therapy Treatment  Patient Details  Name: [name] Moore MRN: [id number] Date of Birth: 01-05-1971 Referring Provider: Norton Blizzard, MD  Encounter Date: 03/22/2016      PT End of Session - 03/22/16 1705    Visit Number 7   Number of Visits 12   Date for PT Re-Evaluation 03/27/16   PT Start Time 1705   PT Stop Time 1744   PT Time Calculation (min) 39 min   Activity Tolerance Patient limited by pain   Behavior During Therapy [facility] for tasks assessed/performed      No past medical history on file.  No past surgical history on file.  There were no vitals filed for this visit.      Subjective Assessment - 03/22/16 1710    Subjective Pt requesting a short visit today secondary to not feeling well.   Patient Stated Goals lessen pain   Currently in Pain? Yes   Pain Score 7    Pain Location Neck   Pain Orientation Left   Pain Score 6   Pain Location Back   Pain Orientation Lower;Left   Pain Score 7   Pain Location Knee   Pain Orientation Right;Anterior;Medial            Today's Treatment  TherEx B Supine HS stretch with strap x30" (limited lift with R LE and reports increased LBP when lifting L LE) Manual B HS stretch by PT x30" (above pain reduced when pt able to relax and allow PT to lift LE) B SKTC stretch 2x30" LTR 5x10" TrA + Hooklying Hip Adduction ball squeeze 10x5" TrA + Hooklying Alternating Knee Extension 10x5" TrA + Hooklying Alternating Hip ABD/ER with green TB 10x5"  DKTC/HS curl with heels on peanut ball 10x3"           PT Short Term Goals - 03/19/16 1827    PT SHORT TERM GOAL #1   Title Assess LBP and establish POC and LTG as appropriate by 02/28/16   Status Achieved           PT Long Term Goals - 03/22/16 1745    PT LONG TERM GOAL #1   Title independent with  HEP as necessary for continued progression / symptom management by 03/27/16   Status On-going   PT LONG TERM GOAL #2   Title c-spine AROM WFL without c/o pain during assessment to allow safe performance of ADLs and driving by 6/96/29   Status On-going   PT LONG TERM GOAL #3   Title pt reports neck pain no greater than 2/10 on AVG and denies difficulty sleeping associated with neck pain by 03/27/16   Status On-going   PT LONG TERM GOAL #4   Title L Shoulder AROM WFL without limitation by pain to allow performance of ADLs, chores, and work duties without restriction by 03/27/16   Status On-going   PT LONG TERM GOAL #5   Title R knee AROM to within 10 dg of L without increased pain by 03/27/16   Status On-going   PT LONG TERM GOAL #6   Title B LE hip and knee strength >/= 4+/5    Status On-going   PT LONG TERM GOAL #7   Title Lumbar flexibility WFL without c/o pain during assessment    Status On-going  Plan - 03/22/16 1750    Clinical Impression Statement Pt has attended 7 therapy session, with focus of initial 5 sessions on cervical/shoulder pain with switch to for latter 2 sessions to R knee and low back secondary to increasing pain in R knee and limited progress with neck/shoulder. Pain remains constant in all of above areas, averaging 6-7/10 with neck/L scapular region/L shoulder and R knee typically worse than low back. Pt reporting abnormal sensation in L side of neck and L side/back of head today but pt with difficulty describing sensation (not headache exactly, but sort of throbbing/pulsating).  Large high density nodule still present in L upper medial scapular mms. Pt with limited tolerance and poor response to manual therapy and exercises for neck/shoulder during initial visit with minimal response to modalities and poor tolerance for heat/cold, therefore shifted focus to R knee and low back per pt request secondary to worsening knee pain, however low back and R knee  pain limit exercise tolerance for opposing problems. Overall, no significant change observed with therapy to date. Pt scheduled for MRI's for neck, low back and R knee on Sat 03/24/16 and wants to f/u with MD before returning to PT.   PT Treatment/Interventions Manual techniques;Therapeutic exercise;Moist Heat;Iontophoresis 4mg /ml Dexamethasone;Electrical Stimulation;Cryotherapy;Taping;Vasopneumatic Device;Functional mobility training;Patient/family education;Dry needling;Traction;Ultrasound;ADLs/Self Care Home Management   PT Next Visit Plan IFC to L neck/upper scapula and/or US combo for pain/relaxation; manual to this area for pain and mobility to c-spine and L scapula; AAROM / AROM as able; Core flexibility & strengthening/stabilization with progressiom of activities per pt tolerance. Ionto #4 of 6 as indicated.    Consulted and Agree with Plan of Care Patient      Patient will benefit from skilled therapeutic intervention in order to improve the following deficits and impairments:  Pain, Decreased strength, Decreased mobility, Increased edema, Postural dysfunction, Impaired UE functional use, Increased muscle spasms, Decreased range of motion  Visit Diagnosis: Cervicalgia  Pain in left shoulder  Left-sided low back pain with left-sided sciatica  Pain in right knee     Problem List Patient Active Problem List   Diagnosis Date Noted  . Left shoulder pain 01/30/2016  . Cervical strain 12/30/2015  . Lumbar strain 12/30/2015  . Right knee injury 12/30/2015  . Chest wall contusion 12/30/2015    Marry GuanJoAnne M Rosely Fernandez, PT, MPT 03/22/2016, 6:17 PM  [facility] [place] [address]  [address] [place], [place], [address] Phone: [phone]   Fax:  [phone]  Name: Blane Oharayad Burgert MRN: [id number] Date of Birth: [birth year]

## 2016-03-24 ENCOUNTER — Ambulatory Visit (HOSPITAL_BASED_OUTPATIENT_CLINIC_OR_DEPARTMENT_OTHER)
Admission: RE | Admit: 2016-03-24 | Discharge: 2016-03-24 | Disposition: A | Discharge status: Home / Self Care | Payer: Medicaid Other | Source: Ambulatory Visit | Attending: Family Medicine | Admitting: Family Medicine

## 2016-03-24 DIAGNOSIS — S8991XD Unspecified injury of right lower leg, subsequent encounter: Secondary | ICD-10-CM

## 2016-03-24 DIAGNOSIS — S83241A Other tear of medial meniscus, current injury, right knee, initial encounter: Secondary | ICD-10-CM | POA: Insufficient documentation

## 2016-03-24 DIAGNOSIS — S39012D Strain of muscle, fascia and tendon of lower back, subsequent encounter: Secondary | ICD-10-CM

## 2016-03-24 DIAGNOSIS — M25461 Effusion, right knee: Secondary | ICD-10-CM | POA: Insufficient documentation

## 2016-03-24 DIAGNOSIS — M25861 Other specified joint disorders, right knee: Secondary | ICD-10-CM | POA: Insufficient documentation

## 2016-03-24 DIAGNOSIS — M7138 Other bursal cyst, other site: Secondary | ICD-10-CM | POA: Diagnosis not present

## 2016-03-24 DIAGNOSIS — S39012A Strain of muscle, fascia and tendon of lower back, initial encounter: Secondary | ICD-10-CM | POA: Insufficient documentation

## 2016-03-24 DIAGNOSIS — S8991XA Unspecified injury of right lower leg, initial encounter: Secondary | ICD-10-CM | POA: Diagnosis present

## 2016-03-26 ENCOUNTER — Ambulatory Visit: Payer: Medicaid Other | Admitting: Physical Therapy

## 2016-03-27 ENCOUNTER — Ambulatory Visit: Payer: Medicaid Other | Admitting: Physical Therapy

## 2016-03-27 DIAGNOSIS — M5442 Lumbago with sciatica, left side: Secondary | ICD-10-CM

## 2016-03-27 DIAGNOSIS — M25561 Pain in right knee: Secondary | ICD-10-CM

## 2016-03-27 DIAGNOSIS — M542 Cervicalgia: Secondary | ICD-10-CM | POA: Diagnosis not present

## 2016-03-27 DIAGNOSIS — M25512 Pain in left shoulder: Secondary | ICD-10-CM

## 2016-03-27 NOTE — Therapy (Signed)
[facility] [address]  [address] [place], [place], [address] Phone: [phone]   Fax:  [phone]  Physical Therapy Treatment  Patient Details  Name: [name] Moore MRN: [id number] Date of Birth: 26-Sep-1971 Referring Provider: Norton BlizzardShane [name], MD  Encounter Date: 03/27/2016      PT End of Session - 03/27/16 1712    Visit Number 8   Number of Visits 12   Date for PT Re-Evaluation 03/27/16   PT Start Time 1707   PT Stop Time 1802   PT Time Calculation (min) 55 min   Activity Tolerance Patient limited by pain   Behavior During Therapy [facility] for tasks assessed/performed      No past medical history on file.  No past surgical history on file.  There were no vitals filed for this visit.      Subjective Assessment - 03/27/16 1709    Subjective Pt had MRI's completed for back and knee on Sat 6/17 but will not be able to f/u with Dr. Pearletha ForgeHudnall until next Monday due to MD on vacation.   Currently in Pain? Yes   Pain Score 6    Pain Location Neck   Pain Score 6   Pain Location Back   Pain Score 7   Pain Location Knee          Today's Treatment  TherEx Manual B HS stretch by PT x30" (repeated cues to relax and allow PT to lift LE without assisting to avoid increased LBP) B SKTC stretch 2x30" LTR 5x10" TrA + Hooklying Alternating Hip ABD/ER with green TB 10x5" (c/o R medial knee pain with return to neutral position) L sidelying R Hip Clam with green TB 10x3" (less pain than previous exercise) TrA + Hooklying March with green TB 10x5"  TrA + Hooklying Hip Adduction ball squeeze 10x5" R SAQ over 8" bolster 10x3"  Vasopneumatic compression to R knee - R LE elevated on rolled yoga mats, medium compression, lowest temp x 10'           PT Short Term Goals - 03/19/16 1827    PT SHORT TERM GOAL #1   Title Assess LBP and establish POC and LTG as appropriate by 02/28/16   Status Achieved           PT Long  Term Goals - 03/22/16 1745    PT LONG TERM GOAL #1   Title independent with HEP as necessary for continued progression / symptom management by 03/27/16   Status On-going   PT LONG TERM GOAL #2   Title c-spine AROM WFL without c/o pain during assessment to allow safe performance of ADLs and driving by 1/32/446/20/17   Status On-going   PT LONG TERM GOAL #3   Title pt reports neck pain no greater than 2/10 on AVG and denies difficulty sleeping associated with neck pain by 03/27/16   Status On-going   PT LONG TERM GOAL #4   Title L Shoulder AROM WFL without limitation by pain to allow performance of ADLs, chores, and work duties without restriction by 03/27/16   Status On-going   PT LONG TERM GOAL #5   Title R knee AROM to within 10 dg of L without increased pain by 03/27/16   Status On-going   PT LONG TERM GOAL #6   Title B LE hip and knee strength >/= 4+/5    Status On-going   PT LONG TERM GOAL #7   Title Lumbar flexibility WFL without  c/o pain during assessment    Status On-going               Plan - 03/27/16 1754    Clinical Impression Statement Pt unable to meet with Dr. Pearletha Forge for f/u from MRI as MD on vacation until Friday, therefore planning to see MD next Mon, 04/02/16, at end of day (unable to schedule formal appt due office and work schedules. Discussed MRI results and impact on PT with pt but deferred plan for further course of medical treatment to MD. Pt continuing to want to focus on R knee with therapt today, but demonstrating limited tolerance for some stretches exercises due to LBP therefore also continued emphasis on lumber stabilzation. Pt remains very reluctant to try new interventions due to apprehension of pain icluding reluctance to try ice/vasopneumatic compression for knee due report of poor sensitivity to cold drafts, but agreed to trial after further education on expected reponse to icing/vasopnematic compression. Pt able to tolerate 10 min treatment with vasopneumatic  compressionand will report back to PT regarding extended response at next visit.   PT Treatment/Interventions Manual techniques;Therapeutic exercise;Moist Heat;Iontophoresis /ml Dexamethasone;Electrical Stimulation;Cryotherapy;Taping;Vasopneumatic Device;Functional mobility training;Patient/family education;Dry needling;Traction;Ultrasound;ADLs/Self Care Home Management   PT Next Visit Plan IFC to L neck/upper scapula and/or [place] combo for pain/relaxation; manual to this area for pain and mobility to c-spine and L scapula; AAROM / AROM as able; Core flexibility & strengthening/stabilization with progressiom of activities per pt tolerance. Ionto #4 of 6 as indicated.    Consulted and Agree with Plan of Care Patient      Patient will benefit from skilled therapeutic intervention in order to improve the following deficits and impairments:  Pain, Decreased strength, Decreased mobility, Increased edema, Postural dysfunction, Impaired UE functional use, Increased muscle spasms, Decreased range of motion  Visit Diagnosis: Cervicalgia  Pain in left shoulder  Left-sided low back pain with left-sided sciatica  Pain in right knee     Problem List Patient Active Problem List   Diagnosis Date Noted  . Left shoulder pain 01/30/2016  . Cervical strain 12/30/2015  . Lumbar strain 12/30/2015  . Right knee injury 12/30/2015  . Chest wall contusion 12/30/2015    Marry Guan, PT, MPT 03/27/2016, 6:28 PM  [facility] [address]  [address] [place], [place], [address] Phone: [phone]   Fax:  [phone]  Name: Kristina Bertone MRN: [id number] Date of Birth: Nov 13, 1970

## 2016-04-02 ENCOUNTER — Ambulatory Visit (INDEPENDENT_AMBULATORY_CARE_PROVIDER_SITE_OTHER): Payer: Medicaid Other | Admitting: Family Medicine

## 2016-04-02 DIAGNOSIS — S161XXD Strain of muscle, fascia and tendon at neck level, subsequent encounter: Secondary | ICD-10-CM | POA: Diagnosis present

## 2016-04-02 DIAGNOSIS — S39012D Strain of muscle, fascia and tendon of lower back, subsequent encounter: Secondary | ICD-10-CM

## 2016-04-02 DIAGNOSIS — S8991XD Unspecified injury of right lower leg, subsequent encounter: Secondary | ICD-10-CM | POA: Diagnosis not present

## 2016-04-02 MED ORDER — MELOXICAM 15 MG PO TABS: 15.0000 mg | ORAL_TABLET | Freq: Every day | ORAL | Status: DC

## 2016-04-02 MED ORDER — METHOCARBAMOL 500 MG PO TABS: 500.0000 mg | ORAL_TABLET | Freq: Three times a day (TID) | ORAL | Status: DC | PRN

## 2016-04-02 NOTE — Patient Instructions (Addendum)
I would continue physical therapy for your right knee as you've noted this has been helping. Continue home exercises for your neck and back - stop physical therapy for this though. 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 If your knee is not continuing to improve over the next month I would recommend seeing an orthopedic surgeon to discuss arthroscopic surgery. Meloxicam 15mg  daily with food for pain and inflammation. Robaxin as needed for muscle spasms. Follow up with me in 1 month.

## 2016-04-03 ENCOUNTER — Ambulatory Visit: Payer: Medicaid Other | Admitting: Physical Therapy

## 2016-04-03 DIAGNOSIS — M25561 Pain in right knee: Secondary | ICD-10-CM

## 2016-04-03 DIAGNOSIS — M542 Cervicalgia: Secondary | ICD-10-CM

## 2016-04-03 DIAGNOSIS — M5442 Lumbago with sciatica, left side: Secondary | ICD-10-CM

## 2016-04-03 DIAGNOSIS — M25512 Pain in left shoulder: Secondary | ICD-10-CM

## 2016-04-03 NOTE — Addendum Note (Signed)
Addended by: Marry GuanKREIS, Jerusalen Mateja M on: 04/03/2016 06:58 PM   Modules accepted: Orders
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
## 2016-04-04 ENCOUNTER — Encounter: Payer: Self-pay | Admitting: Family Medicine
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
## 2016-04-19 ENCOUNTER — Ambulatory Visit: Payer: Medicaid Other | Attending: Family Medicine | Admitting: Physical Therapy

## 2016-04-19 DIAGNOSIS — M25561 Pain in right knee: Secondary | ICD-10-CM | POA: Diagnosis present

## 2016-04-19 NOTE — Therapy (Signed)
[facility] [place] [address]  [address] [place], [place], [address] Phone: [phone]   Fax:  [phone]  Physical Therapy Treatment  Patient Details  Name: [name] Moore MRN: [id number] Date of Birth: 03/16/1971 Referring Provider: Norton BlizzardShane [name], MD  Encounter Date: 04/19/2016      PT End of Session - 04/19/16 1705    Visit Number 11  Visit count corrected for duplicate visit #5   Number of Visits 16   Date for PT Re-Evaluation 05/07/16   PT Start Time 1705   PT Stop Time 1759   PT Time Calculation (min) 54 min   Activity Tolerance Patient tolerated treatment well   Behavior During Therapy [facility] for tasks assessed/performed      No past medical history on file.  No past surgical history on file.  There were no vitals filed for this visit.      Subjective Assessment - 04/19/16 1709    Subjective Pt reports the meloxicam is not helping with the pain so he borrowed some tramadol from his sister. Feels like the ice/compression (vasopnuematic device) helped last treatment.   Patient Stated Goals lessen pain   Currently in Pain? Yes   Pain Score --  6-7/10   Pain Location Knee   Pain Orientation Right         Today's Treatment  TherEx NuStep - lvl 4 x 5' (LE's only) Manual R HS stretch by PT 2x30"  R HS stretch with strap 2x30" R SLR x10 R SLR + ER x10 Bridge + Hip ADD ball squeeze 10x5" R SAQ over 8" bolster 2# 10x3" Seated R HS curl with green TB x10 Seated R Fitter Leg Press (1 black/1 blue) x10 R TKE with blue TB x10  Modalities Vasopneumatic compression to R knee - Supine with R LE elevated, medium compression, lowest temp x 15'           PT Education - 04/19/16 1757    Education provided Yes   Education Details LE HEP   Person(s) Educated Patient   Methods Explanation;Demonstration;Handout   Comprehension Verbalized understanding;Returned demonstration;Need further instruction           PT Short Term Goals - 03/19/16 1827    PT SHORT TERM GOAL #1   Title Assess LBP and establish POC and LTG as appropriate by 02/28/16   Status Achieved           PT Long Term Goals - 04/19/16 1810    PT LONG TERM GOAL #1   Title independent with HEP as necessary for continued progression / symptom management by 05/07/16   Status On-going   PT LONG TERM GOAL #5   Title R knee AROM to within 10 dg of L without increased pain by 05/07/16   Status On-going   PT LONG TERM GOAL #6   Title B LE hip and knee strength >/= 4+/5 by 05/07/16   Status On-going               Plan - 04/19/16 1757    Clinical Impression Statement Pt reporting limited compliance with existing HEP's for neck and low back and has not been completing stretches inlcuding HS stretch, therefore initial limited tolerance for SLR due to hamstring tightness, but able to complete SLR & SLR+ER after stretching. Also limited tolerance noted for TKE due to c/o anterior medial knee pain, but tolerated remaining exercises with complaints other than fatiuge. New LE HEP provided to pt back  on today's exercises.   PT Treatment/Interventions Manual techniques;Therapeutic exercise;Moist Heat;Iontophoresis /ml Dexamethasone;Electrical Stimulation;Cryotherapy;Taping;Vasopneumatic Device;Functional mobility training;Patient/family education;Dry needling;Traction;Ultrasound;ADLs/Self Care Home Management   PT Next Visit Plan LE flexibility & strengthening/stabilization with progression of activities per pt tolerance. Ionto #4 of 6 as indicated.    Consulted and Agree with Plan of Care Patient      Patient will benefit from skilled therapeutic intervention in order to improve the following deficits and impairments:  Pain, Decreased strength, Decreased mobility, Increased edema, Postural dysfunction, Impaired UE functional use, Increased muscle spasms, Decreased range of motion  Visit Diagnosis: Pain in right  knee     Problem List Patient Active Problem List   Diagnosis Date Noted  . Left shoulder pain 01/30/2016  . Cervical strain 12/30/2015  . Lumbar strain 12/30/2015  . Right knee injury 12/30/2015  . Chest wall contusion 12/30/2015    Marry Guan, PT, MPT 04/19/2016, 6:15 PM  [facility] [address]  [address] [place], [place], [address] Phone: [phone]   Fax:  [phone]  Name: Ryon Layton MRN: [id number] Date of Birth: 1971/06/13

## 2016-04-24 ENCOUNTER — Ambulatory Visit: Payer: Medicaid Other | Admitting: Physical Therapy

## 2016-04-24 DIAGNOSIS — M25561 Pain in right knee: Secondary | ICD-10-CM
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
## 2016-04-26 ENCOUNTER — Ambulatory Visit: Payer: Medicaid Other | Admitting: Physical Therapy

## 2016-05-01 ENCOUNTER — Ambulatory Visit: Payer: Medicaid Other | Admitting: Physical Therapy

## 2016-05-01 DIAGNOSIS — M25561 Pain in right knee: Secondary | ICD-10-CM

## 2016-05-01 NOTE — Therapy (Signed)
[facility] [address]  [address] [place], [place], [address] Phone: [phone]   Fax:  [phone]  Physical Therapy Treatment  Patient Details  Name: [name] Moore MRN: [id number] Date of Birth: 09-29-1971 Referring Provider: Norton Blizzard, MD  Encounter Date: 05/01/2016      PT End of Session - 05/01/16 1702    Visit Number 13   Number of Visits 16   Date for PT Re-Evaluation 05/07/16   PT Start Time 1702   PT Stop Time 1756   PT Time Calculation (min) 54 min   Activity Tolerance Patient tolerated treatment well   Behavior During Therapy [facility] for tasks assessed/performed      No past medical history on file.  No past surgical history on file.  There were no vitals filed for this visit.      Subjective Assessment - 05/01/16 1702    Subjective Pt reports he is still borrowing some tramadol from his sister but only takes it when the pain is really bad. Continues to note benefit from ice and vasopnuematic compression.   Patient Stated Goals lessen pain   Currently in Pain? Yes   Pain Score --  5-6/10   Pain Location Knee           Today's Treatment  TherEx Rec Bike - lvl 2 x 3' (stopped due to c/o increased medial joint line pain) Seated Fitter R LE Leg Press (1 black) x15 Standing Fitter Abduction & Extension (1 black) x15 each Standing 4 way SLR with red TB x10, green TB x5;  intermittent 1 pole A BATCA Knee Flexion 20# x10 B con/ecc; 20# B con/R ecc BATCA Knee Extension 20# x5 (deferred d/t increased R knee pain) 6" R Fwd Step-up x10 6" R Fwd Step-Up + TKE with black TB x10 R TKE with black TB x10  Modalities Vasopneumatic compression to R knee - Supine with R LE elevated, medium compression, lowest temp x 10'           PT Education - 05/01/16 1752    Education provided Yes   Education Details Standing exercises added to LE HEP   Person(s) Educated Patient   Methods  Explanation;Demonstration;Verbal cues;Handout   Comprehension Verbalized understanding;Returned demonstration;Verbal cues required;Need further instruction          PT Short Term Goals - 03/19/16 1827      PT SHORT TERM GOAL #1   Title Assess LBP and establish POC and LTG as appropriate by 02/28/16   Status Achieved           PT Long Term Goals - 04/19/16 1810      PT LONG TERM GOAL #1   Title independent with HEP as necessary for continued progression / symptom management by 05/07/16   Status On-going     PT LONG TERM GOAL #5   Title R knee AROM to within 10 dg of L without increased pain by 05/07/16   Status On-going     PT LONG TERM GOAL #6   Title B LE hip and knee strength >/= 4+/5 by 05/07/16   Status On-going               Plan - 05/01/16 1753    Clinical Impression Statement Pt reporting no further issues with HEP since SLR+ER deferred. Progressed LE exercises to standing focus today with HEP updated accordingly. Introduced weight machines with good tolerance for knee flexion, but pain noted with extension.  PT Treatment/Interventions Manual techniques;Therapeutic exercise;Moist Heat;Iontophoresis /ml Dexamethasone;Electrical Stimulation;Cryotherapy;Taping;Vasopneumatic Device;Functional mobility training;Patient/family education;Dry needling;Traction;Ultrasound;ADLs/Self Care Home Management   PT Next Visit Plan LE flexibility & strengthening/stabilization with progression of activities per pt tolerance. Ionto #4 of 6 as indicated. ?Trial of kinesiotaping for R knee.   Consulted and Agree with Plan of Care Patient      Patient will benefit from skilled therapeutic intervention in order to improve the following deficits and impairments:  Pain, Decreased strength, Decreased mobility, Increased edema, Postural dysfunction, Impaired UE functional use, Increased muscle spasms, Decreased range of motion  Visit Diagnosis: Pain in right knee     Problem  List Patient Active Problem List   Diagnosis Date Noted  . Left shoulder pain 01/30/2016  . Cervical strain 12/30/2015  . Lumbar strain 12/30/2015  . Right knee injury 12/30/2015  . Chest wall contusion 12/30/2015    Marry Guan 05/01/2016, 6:02 PM  [facility] [place] [address]  [address] [place], [place], [address] Phone: [phone]   Fax:  [phone]  Name: [name] Moore MRN: [id number] Date of Birth: [birth year]

## 2016-05-03 ENCOUNTER — Ambulatory Visit: Payer: Medicaid Other | Admitting: Physical Therapy

## 2016-05-03 DIAGNOSIS — M25561 Pain in right knee: Secondary | ICD-10-CM | POA: Diagnosis not present
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
## 2016-05-07 ENCOUNTER — Ambulatory Visit: Payer: Medicaid Other | Admitting: Physical Therapy

## 2016-05-07 DIAGNOSIS — M25561 Pain in right knee: Secondary | ICD-10-CM
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
## 2016-05-08 ENCOUNTER — Ambulatory Visit (INDEPENDENT_AMBULATORY_CARE_PROVIDER_SITE_OTHER): Payer: Medicaid Other | Admitting: Family Medicine

## 2016-05-08 ENCOUNTER — Encounter: Payer: Self-pay | Admitting: Family Medicine

## 2016-05-08 DIAGNOSIS — S161XXD Strain of muscle, fascia and tendon at neck level, subsequent encounter: Secondary | ICD-10-CM | POA: Diagnosis not present

## 2016-05-08 DIAGNOSIS — S39012D Strain of muscle, fascia and tendon of lower back, subsequent encounter: Secondary | ICD-10-CM | POA: Diagnosis not present

## 2016-05-08 DIAGNOSIS — S8991XD Unspecified injury of right lower leg, subsequent encounter: Secondary | ICD-10-CM
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
## 2016-05-16 ENCOUNTER — Encounter: Payer: Self-pay | Admitting: Family Medicine
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
## 2016-05-17 ENCOUNTER — Telehealth: Payer: Self-pay | Admitting: Family Medicine

## 2016-05-21 NOTE — Addendum Note (Signed)
Addended by: Kathi SimpersWISE, Delphina Schum F on: 05/21/2016 02:32 PM   Modules accepted: Orders

## 2016-05-22 NOTE — Telephone Encounter (Signed)
Appointment scheduled.

## 2016-06-25 NOTE — Progress Notes (Signed)
Pt is being scheduled for preop appt; please place surgical orders in epic. Thanks.  

## 2016-06-26 ENCOUNTER — Other Ambulatory Visit: Payer: Self-pay | Admitting: Orthopaedic Surgery

## 2016-06-27 NOTE — Patient Instructions (Addendum)
[name] Moore  06/27/2016   Your procedure is scheduled on: 07-06-16   Report to [facility]  [facility]  [facility] to 3rd floor to  Short Stay Center at    1:30 PM.  Call this number if you have problems the morning of surgery [phone]   Remember: ONLY 1 PERSON MAY GO WITH YOU TO SHORT STAY TO GET  READY MORNING OF YOUR SURGERY.  Do not eat food or drink liquids :After Midnight.     Take these medicines the morning of surgery with A SIP OF WATER: None. DO NOT TAKE ANY DIABETIC MEDICATIONS DAY OF YOUR SURGERY                               You may not have any metal on your body including hair pins and              piercings  Do not wear jewelry, make-up, lotions, powders or perfumes, deodorant             Do not wear nail polish.  Do not shave  48 hours prior to surgery.              Men may shave face and neck.   Do not bring valuables to the hospital. [facility] IS NOT             RESPONSIBLE   FOR VALUABLES.  Contacts, dentures or bridgework may not be worn into surgery.  Leave suitcase in the car. After surgery it may be brought to your room.     Patients discharged the day of surgery will not be allowed to drive home.  Name and phone number of your driver: Mustafa-son [phone] cell  Special Instructions: N/A              Please read over the following fact sheets you were given: _____________________________________________________________________             [facility] - Preparing for Surgery Before surgery, you can play an important role.  Because skin is not sterile, your skin needs to be as free of germs as possible.  You can reduce the number of germs on your skin by washing with CHG (chlorahexidine gluconate) soap before surgery.  CHG is an antiseptic cleaner which kills germs and bonds with the skin to continue killing germs even after washing. Please DO NOT use if you have an allergy to CHG or antibacterial soaps.  If  your skin becomes reddened/irritated stop using the CHG and inform your nurse when you arrive at Short Stay. Do not shave (including legs and underarms) for at least 48 hours prior to the first CHG shower.  You may shave your face/neck. Please follow these instructions carefully:  1.  Shower with CHG Soap the night before surgery and the  morning of Surgery.  2.  If you choose to wash your hair, wash your hair first as usual with your  normal  shampoo.  3.  After you shampoo, rinse your hair and body thoroughly to remove the  shampoo.                           4.  Use CHG as you would any other liquid soap.  You can apply chg directly  to  the skin and wash                       Gently with a scrungie or clean washcloth.  5.  Apply the CHG Soap to your body ONLY FROM THE NECK DOWN.   Do not use on face/ open                           Wound or open sores. Avoid contact with eyes, ears mouth and genitals (private parts).                       Wash face,  Genitals (private parts) with your normal soap.             6.  Wash thoroughly, paying special attention to the area where your surgery  will be performed.  7.  Thoroughly rinse your body with warm water from the neck down.  8.  DO NOT shower/wash with your normal soap after using and rinsing off  the CHG Soap.                9.  Pat yourself dry with a clean towel.            10.  Wear clean pajamas.            11.  Place clean sheets on your bed the night of your first shower and do not  sleep with pets. Day of Surgery : Do not apply any lotions/deodorants the morning of surgery.  Please wear clean clothes to the hospital/surgery center.  FAILURE TO FOLLOW THESE INSTRUCTIONS MAY RESULT IN THE CANCELLATION OF YOUR SURGERY PATIENT SIGNATURE_________________________________  NURSE SIGNATURE__________________________________  ________________________________________________________________________

## 2016-06-29 ENCOUNTER — Encounter (HOSPITAL_COMMUNITY): Payer: Self-pay

## 2016-06-29 ENCOUNTER — Encounter (HOSPITAL_COMMUNITY)
Admission: RE | Admit: 2016-06-29 | Discharge: 2016-06-29 | Disposition: A | Discharge status: Home / Self Care | Payer: Medicaid Other | Source: Ambulatory Visit | Attending: Orthopaedic Surgery | Admitting: Orthopaedic Surgery

## 2016-06-29 DIAGNOSIS — S161XXA Strain of muscle, fascia and tendon at neck level, initial encounter: Secondary | ICD-10-CM | POA: Diagnosis not present

## 2016-06-29 DIAGNOSIS — M25512 Pain in left shoulder: Secondary | ICD-10-CM | POA: Diagnosis not present

## 2016-06-29 DIAGNOSIS — S20219A Contusion of unspecified front wall of thorax, initial encounter: Secondary | ICD-10-CM | POA: Diagnosis not present

## 2016-06-29 DIAGNOSIS — Z01812 Encounter for preprocedural laboratory examination: Secondary | ICD-10-CM | POA: Insufficient documentation

## 2016-06-29 DIAGNOSIS — S8991XA Unspecified injury of right lower leg, initial encounter: Secondary | ICD-10-CM | POA: Insufficient documentation

## 2016-06-29 HISTORY — DX: Personal history of colonic polyps: Z86.010

## 2016-06-29 LAB — CBC
HEMATOCRIT: 43.9 % (ref 39.0–52.0)
Hemoglobin: 14.3 g/dL (ref 13.0–17.0)
MCH: 27.9 pg (ref 26.0–34.0)
MCHC: 32.6 g/dL (ref 30.0–36.0)
MCV: 85.6 fL (ref 78.0–100.0)
Platelets: 199 10*3/uL (ref 150–400)
RBC: 5.13 MIL/uL (ref 4.22–5.81)
RDW: 13.3 % (ref 11.5–15.5)
WBC: 6 10*3/uL (ref 4.0–10.5)

## 2016-06-29 NOTE — Pre-Procedure Instructions (Signed)
CXR 3'17

## 2016-07-05 ENCOUNTER — Ambulatory Visit (HOSPITAL_COMMUNITY): Payer: Medicaid Other | Admitting: Certified Registered Nurse Anesthetist

## 2016-07-06 ENCOUNTER — Encounter (HOSPITAL_COMMUNITY): Payer: Self-pay | Admitting: *Deleted

## 2016-07-06 ENCOUNTER — Encounter (HOSPITAL_COMMUNITY)
Admission: RE | Disposition: A | Discharge status: Home / Self Care | Payer: Self-pay | Source: Ambulatory Visit | Attending: Orthopaedic Surgery

## 2016-07-06 ENCOUNTER — Ambulatory Visit (HOSPITAL_COMMUNITY)
Admission: RE | Admit: 2016-07-06 | Discharge: 2016-07-06 | Disposition: A | Discharge status: Home / Self Care | Payer: Medicaid Other | Source: Ambulatory Visit | Attending: Orthopaedic Surgery | Admitting: Orthopaedic Surgery

## 2016-07-06 DIAGNOSIS — S83241A Other tear of medial meniscus, current injury, right knee, initial encounter: Secondary | ICD-10-CM | POA: Diagnosis not present

## 2016-07-06 DIAGNOSIS — S83281D Other tear of lateral meniscus, current injury, right knee, subsequent encounter: Secondary | ICD-10-CM

## 2016-07-06 DIAGNOSIS — Z5309 Procedure and treatment not carried out because of other contraindication: Secondary | ICD-10-CM | POA: Diagnosis not present

## 2016-07-06 DIAGNOSIS — S83281A Other tear of lateral meniscus, current injury, right knee, initial encounter: Secondary | ICD-10-CM

## 2016-07-06 DIAGNOSIS — X58XXXA Exposure to other specified factors, initial encounter: Secondary | ICD-10-CM | POA: Insufficient documentation

## 2016-07-06 SURGERY — ARTHROSCOPY, KNEE, WITH MEDIAL MENISCECTOMY
Anesthesia: General | Laterality: Right

## 2016-07-06 MED ORDER — CEFAZOLIN SODIUM-DEXTROSE 2-4 GM/100ML-% IV SOLN: 2.0000 g | INTRAVENOUS | Status: DC

## 2016-07-06 MED ORDER — LIDOCAINE 2% (20 MG/ML) 5 ML SYRINGE
INTRAMUSCULAR | Status: AC
  Filled 2016-07-06: qty 5

## 2016-07-06 MED ORDER — FENTANYL CITRATE (PF) 100 MCG/2ML IJ SOLN
INTRAMUSCULAR | Status: AC
  Filled 2016-07-06: qty 2

## 2016-07-06 MED ORDER — PROPOFOL 10 MG/ML IV BOLUS
INTRAVENOUS | Status: AC
  Filled 2016-07-06: qty 20

## 2016-07-06 MED ORDER — ONDANSETRON HCL 4 MG/2ML IJ SOLN
INTRAMUSCULAR | Status: AC
  Filled 2016-07-06: qty 2

## 2016-07-06 MED ORDER — DEXAMETHASONE SODIUM PHOSPHATE 10 MG/ML IJ SOLN
INTRAMUSCULAR | Status: AC
  Filled 2016-07-06: qty 1

## 2016-07-06 MED ORDER — LACTATED RINGERS IV SOLN: INTRAVENOUS | Status: DC

## 2016-07-06 MED ORDER — MIDAZOLAM HCL 2 MG/2ML IJ SOLN
INTRAMUSCULAR | Status: AC
  Filled 2016-07-06: qty 2

## 2016-07-06 NOTE — H&P (Signed)
[name] Moore is an 45 y.o. male.   Chief Complaint:   Right knee pain with locking, catching HPI:   45 yo male with an acute injury to his right knee.  After continued pain with locking and catching, a MRI was obtained showing a large medial meniscal tear.  Surgery has been recommended given his continued pain and mechanical symptoms.  Past Medical History:  Diagnosis Date  . History of colon polyps     Past Surgical History:  Procedure Laterality Date  . COLONOSCOPY     polyps removed    No family history on file. Social History:  reports that he has never smoked. He has never used smokeless tobacco. He reports that he does not drink alcohol or use drugs.  Allergies:  Allergies  Allergen Reactions  . Hydrocodone Other (See Comments)    headache    No prescriptions prior to admission.    No results found for this or any previous visit (from the past 48 hour(s)). No results found.  Review of Systems  All other systems reviewed and are negative.   There were no vitals taken for this visit. Physical Exam  Constitutional: He is oriented to person, place, and time. He appears well-developed and well-nourished.  HENT:  Head: Normocephalic and atraumatic.  Eyes: EOM are normal. Pupils are equal, round, and reactive to light.  Neck: Normal range of motion. Neck supple.  Cardiovascular: Normal rate and regular rhythm.   Respiratory: Effort normal and breath sounds normal.  GI: Soft. Bowel sounds are normal.  Musculoskeletal:       Right knee: He exhibits decreased range of motion, swelling and effusion. Tenderness found. Medial joint line tenderness noted.  Neurological: He is alert and oriented to person, place, and time.  Skin: Skin is warm and dry.  Psychiatric: He has a normal mood and affect.     Assessment/Plan Right knee with symptomatic medial meniscal tear 1)  To the OR today for a right knee arthroscopy with a partial medial meniscectomy.  Risks and benefits  discussed in detail.  Kathryne Hitchhristopher Y Tomer Chalmers, MD 07/06/2016, 7:33 AM

## 2016-07-06 NOTE — Progress Notes (Signed)
Pt reports had 2 creams and 3 sugars in coffee at 1230. Dr Ivin Bootyrews and Dr Magnus IvanBlackman advised. Surgery cancelled.

## 2016-07-06 NOTE — Progress Notes (Signed)
Surgery was cancelled.  Pt back to room at 1509.  Pt received no meds and no Iv.  Gave pt his clothing and shoes.  Pt called his ride and then pt walked downstairs to meet his ride.  Pt stable, alert and oriented.

## 2016-07-16 ENCOUNTER — Inpatient Hospital Stay (INDEPENDENT_AMBULATORY_CARE_PROVIDER_SITE_OTHER): Payer: Self-pay | Admitting: Orthopaedic Surgery

## 2016-07-28 ENCOUNTER — Other Ambulatory Visit: Payer: Self-pay | Admitting: Orthopaedic Surgery

## 2016-08-02 ENCOUNTER — Encounter (HOSPITAL_COMMUNITY)
Admission: RE | Admit: 2016-08-02 | Discharge: 2016-08-02 | Disposition: A | Discharge status: Home / Self Care | Payer: Medicaid Other | Source: Ambulatory Visit | Attending: Orthopaedic Surgery | Admitting: Orthopaedic Surgery

## 2016-08-02 ENCOUNTER — Encounter (HOSPITAL_COMMUNITY): Payer: Self-pay

## 2016-08-02 DIAGNOSIS — Z01818 Encounter for other preprocedural examination: Secondary | ICD-10-CM | POA: Insufficient documentation

## 2016-08-02 LAB — CBC
HEMATOCRIT: 44.5 % (ref 39.0–52.0)
HEMOGLOBIN: 14.7 g/dL (ref 13.0–17.0)
MCH: 28.1 pg (ref 26.0–34.0)
MCHC: 33 g/dL (ref 30.0–36.0)
MCV: 85.1 fL (ref 78.0–100.0)
Platelets: 206 10*3/uL (ref 150–400)
RBC: 5.23 MIL/uL (ref 4.22–5.81)
RDW: 13.4 % (ref 11.5–15.5)
WBC: 7.5 10*3/uL (ref 4.0–10.5)

## 2016-08-02 NOTE — Patient Instructions (Signed)
Kasra Randol  08/02/2016   Your procedure is scheduled on: 08-03-16  [facility]  [facility] to 3rd floor to  Short Stay Center at 100 pm  Call this number if you have problems the morning of surgery [phone]   Remember: ONLY 1 PERSON MAY GO WITH YOU TO SHORT STAY TO GET  READY MORNING OF YOUR SURGERY.  Do not eat food :After Midnight, MAY HAVE CLEAR LIQUIDS FROM MIDNIGHT UNTIL 900 AM DAY OF SURGERY, NOTHING BY MOUTH AFTER 900 AM DAY OF SURGERY.     Take these medicines the morning of surgery with A SIP OF WATER: NONE              You may not have any metal on your body including hair pins and              piercings  Do not wear jewelry, make-up, lotions, powders or perfumes, deodorant             Do not wear nail polish.  Do not shave  48 hours prior to surgery.              Men may shave face and neck.   Do not bring valuables to the hospital. [facility] IS NOT             RESPONSIBLE   FOR VALUABLES.  Contacts, dentures or bridgework may not be worn into surgery.  Leave suitcase in the car. After surgery it may be brought to your room.     Patients discharged the day of surgery will not be allowed to drive home.  Name and phone number of your driver:  Special Instructions: N/A              Please read over the following fact sheets you were given: _____________________________________________________________________                CLEAR LIQUID DIET   Foods Allowed                                                                     Foods Excluded  Coffee and tea, regular and decaf                             liquids that you cannot  Plain Jell-O in any flavor                                             see through such as: Fruit ices (not with fruit pulp)                                     milk, soups, orange juice  Iced Popsicles  All solid food Carbonated  beverages, regular and diet                                    Cranberry, grape and apple juices Sports drinks like Gatorade Lightly seasoned clear broth or consume(fat free) Sugar, honey syrup  Sample Menu Breakfast                                Lunch                                     Supper Cranberry juice                    Beef broth                            Chicken broth Jell-O                                     Grape juice                           Apple juice Coffee or tea                        Jell-O                                      Popsicle                                                Coffee or tea                        Coffee or tea  _____________________________________________________________________  [facility] - Preparing for Surgery Before surgery, you can play an important role.  Because skin is not sterile, your skin needs to be as free of germs as possible.  You can reduce the number of germs on your skin by washing with CHG (chlorahexidine gluconate) soap before surgery.  CHG is an antiseptic cleaner which kills germs and bonds with the skin to continue killing germs even after washing. Please DO NOT use if you have an allergy to CHG or antibacterial soaps.  If your skin becomes reddened/irritated stop using the CHG and inform your nurse when you arrive at Short Stay. Do not shave (including legs and underarms) for at least 48 hours prior to the first CHG shower.  You may shave your face/neck. Please follow these instructions carefully:  1.  Shower with CHG Soap the night before surgery and the  morning of Surgery.  2.  If you choose to wash your hair, wash your hair first as usual with your  normal  shampoo.  3.  After you shampoo, rinse your hair and body thoroughly to remove the  shampoo.  4.  Use CHG as you would any other liquid soap.  You can apply chg directly  to the skin and wash                       Gently with a scrungie or  clean washcloth.  5.  Apply the CHG Soap to your body ONLY FROM THE NECK DOWN.   Do not use on face/ open                           Wound or open sores. Avoid contact with eyes, ears mouth and genitals (private parts).                       Wash face,  Genitals (private parts) with your normal soap.             6.  Wash thoroughly, paying special attention to the area where your surgery  will be performed.  7.  Thoroughly rinse your body with warm water from the neck down.  8.  DO NOT shower/wash with your normal soap after using and rinsing off  the CHG Soap.                9.  Pat yourself dry with a clean towel.            10.  Wear clean pajamas.            11.  Place clean sheets on your bed the night of your first shower and do not  sleep with pets. Day of Surgery : Do not apply any lotions/deodorants the morning of surgery.  Please wear clean clothes to the hospital/surgery center.  FAILURE TO FOLLOW THESE INSTRUCTIONS MAY RESULT IN THE CANCELLATION OF YOUR SURGERY PATIENT SIGNATURE_________________________________  NURSE SIGNATURE__________________________________  ________________________________________________________________________

## 2016-08-02 NOTE — Progress Notes (Signed)
CHEST XRAY 12-11-15 EPIC

## 2016-08-03 ENCOUNTER — Encounter (HOSPITAL_COMMUNITY)
Admission: RE | Disposition: A | Discharge status: Home / Self Care | Payer: Self-pay | Source: Ambulatory Visit | Attending: Orthopaedic Surgery

## 2016-08-03 ENCOUNTER — Ambulatory Visit (HOSPITAL_COMMUNITY): Payer: Medicaid Other | Admitting: Certified Registered Nurse Anesthetist

## 2016-08-03 ENCOUNTER — Encounter (HOSPITAL_COMMUNITY): Payer: Self-pay

## 2016-08-03 ENCOUNTER — Ambulatory Visit (HOSPITAL_COMMUNITY)
Admission: RE | Admit: 2016-08-03 | Discharge: 2016-08-03 | Disposition: A | Discharge status: Home / Self Care | Payer: Medicaid Other | Source: Ambulatory Visit | Attending: Orthopaedic Surgery | Admitting: Orthopaedic Surgery

## 2016-08-03 DIAGNOSIS — M23221 Derangement of posterior horn of medial meniscus due to old tear or injury, right knee: Secondary | ICD-10-CM | POA: Insufficient documentation

## 2016-08-03 DIAGNOSIS — M1711 Unilateral primary osteoarthritis, right knee: Secondary | ICD-10-CM | POA: Insufficient documentation

## 2016-08-03 DIAGNOSIS — E669 Obesity, unspecified: Secondary | ICD-10-CM | POA: Insufficient documentation

## 2016-08-03 DIAGNOSIS — Z885 Allergy status to narcotic agent status: Secondary | ICD-10-CM | POA: Insufficient documentation

## 2016-08-03 DIAGNOSIS — M25461 Effusion, right knee: Secondary | ICD-10-CM | POA: Diagnosis not present

## 2016-08-03 DIAGNOSIS — S838X1A Sprain of other specified parts of right knee, initial encounter: Secondary | ICD-10-CM

## 2016-08-03 DIAGNOSIS — Z6832 Body mass index (BMI) 32.0-32.9, adult: Secondary | ICD-10-CM | POA: Insufficient documentation

## 2016-08-03 HISTORY — PX: KNEE ARTHROSCOPY WITH MEDIAL MENISECTOMY: SHX5651

## 2016-08-03 SURGERY — ARTHROSCOPY, KNEE, WITH MEDIAL MENISCECTOMY
Anesthesia: General | Site: Knee | Laterality: Right

## 2016-08-03 MED ORDER — DEXAMETHASONE SODIUM PHOSPHATE 10 MG/ML IJ SOLN
INTRAMUSCULAR | Status: DC | PRN
  Administered 2016-08-03: 10 mg via INTRAVENOUS

## 2016-08-03 MED ORDER — FENTANYL CITRATE (PF) 100 MCG/2ML IJ SOLN
25.0000 ug | INTRAMUSCULAR | Status: DC | PRN
  Administered 2016-08-03 (×3): 50 ug via INTRAVENOUS

## 2016-08-03 MED ORDER — MIDAZOLAM HCL 2 MG/2ML IJ SOLN
INTRAMUSCULAR | Status: AC
  Filled 2016-08-03: qty 2

## 2016-08-03 MED ORDER — TRAMADOL HCL 50 MG PO TABS
100.0000 mg | ORAL_TABLET | Freq: Once | ORAL | Status: AC
  Administered 2016-08-03: 100 mg via ORAL
  Filled 2016-08-03: qty 2

## 2016-08-03 MED ORDER — PROPOFOL 10 MG/ML IV BOLUS
INTRAVENOUS | Status: DC | PRN
  Administered 2016-08-03: 200 mg via INTRAVENOUS

## 2016-08-03 MED ORDER — ONDANSETRON HCL 4 MG/2ML IJ SOLN
INTRAMUSCULAR | Status: AC
  Filled 2016-08-03: qty 2

## 2016-08-03 MED ORDER — OXYCODONE-ACETAMINOPHEN 5-325 MG PO TABS: 1.0000 | ORAL_TABLET | ORAL | 0 refills | Status: AC | PRN

## 2016-08-03 MED ORDER — PROPOFOL 10 MG/ML IV BOLUS
INTRAVENOUS | Status: AC
  Filled 2016-08-03: qty 20

## 2016-08-03 MED ORDER — LIDOCAINE 2% (20 MG/ML) 5 ML SYRINGE
INTRAMUSCULAR | Status: DC | PRN
  Administered 2016-08-03: 100 mg via INTRAVENOUS

## 2016-08-03 MED ORDER — LIDOCAINE 2% (20 MG/ML) 5 ML SYRINGE
INTRAMUSCULAR | Status: AC
  Filled 2016-08-03: qty 5

## 2016-08-03 MED ORDER — FENTANYL CITRATE (PF) 100 MCG/2ML IJ SOLN
INTRAMUSCULAR | Status: DC | PRN
Start: 2016-08-03 — End: 2016-08-03
  Administered 2016-08-03 (×2): 50 ug via INTRAVENOUS

## 2016-08-03 MED ORDER — MORPHINE SULFATE (PF) 4 MG/ML IV SOLN
INTRAVENOUS | Status: DC | PRN
Start: 2016-08-03 — End: 2016-08-03
  Administered 2016-08-03: 4 mg

## 2016-08-03 MED ORDER — ONDANSETRON HCL 4 MG/2ML IJ SOLN
INTRAMUSCULAR | Status: DC | PRN
  Administered 2016-08-03: 4 mg via INTRAVENOUS

## 2016-08-03 MED ORDER — MORPHINE SULFATE (PF) 4 MG/ML IV SOLN
INTRAVENOUS | Status: AC
  Filled 2016-08-03: qty 1

## 2016-08-03 MED ORDER — FENTANYL CITRATE (PF) 100 MCG/2ML IJ SOLN
INTRAMUSCULAR | Status: AC
  Filled 2016-08-03: qty 2

## 2016-08-03 MED ORDER — BUPIVACAINE HCL (PF) 0.25 % IJ SOLN
INTRAMUSCULAR | Status: AC
  Filled 2016-08-03: qty 30

## 2016-08-03 MED ORDER — CEFAZOLIN SODIUM-DEXTROSE 2-4 GM/100ML-% IV SOLN
2.0000 g | INTRAVENOUS | Status: AC
  Administered 2016-08-03: 2 g via INTRAVENOUS

## 2016-08-03 MED ORDER — LACTATED RINGERS IR SOLN
Status: DC | PRN
  Administered 2016-08-03 (×2): 3000 mL

## 2016-08-03 MED ORDER — BUPIVACAINE HCL (PF) 0.25 % IJ SOLN
INTRAMUSCULAR | Status: DC | PRN
  Administered 2016-08-03: 20 mL via INTRA_ARTICULAR

## 2016-08-03 MED ORDER — MIDAZOLAM HCL 5 MG/5ML IJ SOLN
INTRAMUSCULAR | Status: DC | PRN
  Administered 2016-08-03: 2 mg via INTRAVENOUS

## 2016-08-03 MED ORDER — ONDANSETRON HCL 4 MG/2ML IJ SOLN: 4.0000 mg | Freq: Once | INTRAMUSCULAR | Status: DC | PRN

## 2016-08-03 MED ORDER — LACTATED RINGERS IV SOLN
INTRAVENOUS | Status: DC
  Administered 2016-08-03: 13:00:00 via INTRAVENOUS

## 2016-08-03 MED ORDER — TRAMADOL HCL 50 MG PO TABS: 50.0000 mg | ORAL_TABLET | Freq: Four times a day (QID) | ORAL | 0 refills | Status: AC | PRN

## 2016-08-03 MED ORDER — DEXAMETHASONE SODIUM PHOSPHATE 10 MG/ML IJ SOLN
INTRAMUSCULAR | Status: AC
  Filled 2016-08-03: qty 1

## 2016-08-03 MED ORDER — CEFAZOLIN SODIUM-DEXTROSE 2-4 GM/100ML-% IV SOLN
INTRAVENOUS | Status: AC
  Filled 2016-08-03: qty 100

## 2016-08-03 SURGICAL SUPPLY — 31 items
BANDAGE ACE 6X5 VEL STRL LF (GAUZE/BANDAGES/DRESSINGS) ×3 IMPLANT
BANDAGE ELASTIC 6 VELCRO ST LF (GAUZE/BANDAGES/DRESSINGS) ×3 IMPLANT
BENZOIN TINCTURE PRP APPL 2/3 (GAUZE/BANDAGES/DRESSINGS) ×3 IMPLANT
BLADE CUDA SHAVER 3.5 (BLADE) ×3 IMPLANT
CLOSURE WOUND 1/2 X4 (GAUZE/BANDAGES/DRESSINGS) ×1
CUFF TOURN SGL QUICK 34 (TOURNIQUET CUFF)
CUFF TRNQT CYL 34X4X40X1 (TOURNIQUET CUFF) IMPLANT
DRAPE U-SHAPE 47X51 STRL (DRAPES) ×3 IMPLANT
DRSG PAD ABDOMINAL 8X10 ST (GAUZE/BANDAGES/DRESSINGS) ×3 IMPLANT
DURAPREP 26ML APPLICATOR (WOUND CARE) ×3 IMPLANT
GAUZE SPONGE 4X4 12PLY STRL (GAUZE/BANDAGES/DRESSINGS) ×3 IMPLANT
GAUZE XEROFORM 1X8 LF (GAUZE/BANDAGES/DRESSINGS) ×3 IMPLANT
GLOVE BIO SURGEON STRL SZ7.5 (GLOVE) ×3 IMPLANT
GLOVE BIOGEL PI IND STRL 8 (GLOVE) ×2 IMPLANT
GLOVE BIOGEL PI INDICATOR 8 (GLOVE) ×4
GLOVE ECLIPSE 8.0 STRL XLNG CF (GLOVE) ×3 IMPLANT
GOWN STRL REUS W/TWL XL LVL3 (GOWN DISPOSABLE) ×6 IMPLANT
IV LACTATED RINGER IRRG 3000ML (IV SOLUTION) ×4
IV LR IRRIG 3000ML ARTHROMATIC (IV SOLUTION) ×2 IMPLANT
KIT BASIN OR (CUSTOM PROCEDURE TRAY) IMPLANT
MANIFOLD NEPTUNE II (INSTRUMENTS) ×3 IMPLANT
PACK ARTHROSCOPY WL (CUSTOM PROCEDURE TRAY) ×3 IMPLANT
PAD ABD 8X10 STRL (GAUZE/BANDAGES/DRESSINGS) ×3 IMPLANT
PADDING CAST COTTON 6X4 STRL (CAST SUPPLIES) ×3 IMPLANT
POSITIONER SURGICAL ARM (MISCELLANEOUS) ×3 IMPLANT
STRIP CLOSURE SKIN 1/2X4 (GAUZE/BANDAGES/DRESSINGS) ×2 IMPLANT
SUT ETHILON 4 0 PS 2 18 (SUTURE) ×3 IMPLANT
SYR CONTROL 10ML LL (SYRINGE) ×3 IMPLANT
TOWEL OR 17X26 10 PK STRL BLUE (TOWEL DISPOSABLE) ×3 IMPLANT
WAND HAND CNTRL MULTIVAC 90 (MISCELLANEOUS) IMPLANT
WRAP KNEE MAXI GEL POST OP (GAUZE/BANDAGES/DRESSINGS) ×3 IMPLANT

## 2016-08-03 NOTE — Anesthesia Postprocedure Evaluation (Signed)
Anesthesia Post Note  Patient: [name] Moore  Procedure(s) Performed: Procedure(s) (LRB): RIGHT KNEE ARTHROSCOPY WITH PARTIAL MEDIAL MENISCECTOMY (Right)  Patient location during evaluation: PACU Anesthesia Type: General Level of consciousness: awake and alert Pain management: pain level controlled Vital Signs Assessment: post-procedure vital signs reviewed and stable Respiratory status: spontaneous breathing, nonlabored ventilation, respiratory function stable and patient connected to nasal cannula oxygen Cardiovascular status: blood pressure returned to baseline and stable Postop Assessment: no signs of nausea or vomiting Anesthetic complications: no    Last Vitals:  Vitals:   08/03/16 1550 08/03/16 1559  BP: 121/89 109/82  Pulse: 78   Resp: 14 14  Temp:  36.4 C    Last Pain:  Vitals:   08/03/16 1600  TempSrc:   PainSc: 6                  Virdia Ziesmer JENNETTE

## 2016-08-03 NOTE — Discharge Instructions (Signed)
Increase your activities as comfort allows. You can put full weight on your right knee.leg. Expect swelling - ice and elevation as needed. You can remove your dressings in 24 hours and start getting your incisions wet in the shower. Daily band-aids to your incisions.

## 2016-08-03 NOTE — Progress Notes (Signed)
  August 03, 2016  Patient: [name] Moore  Date of Birth: [birth year]  Date of Visit: 08/03/16    To Whom It May Concern:  [name] Moore was seen and treated in our hospital facility on 08/03/16.   Sincerely,   Nursing staff/Dr. Isaias SakaiBlackman [facility]

## 2016-08-03 NOTE — Anesthesia Procedure Notes (Signed)
Procedure Name: LMA Insertion Date/Time: 08/03/2016 2:25 PM Performed by: Enriqueta ShutterWILLIFORD, Daren Yeagle D Pre-anesthesia Checklist: Patient identified, Emergency Drugs available, Suction available and Patient being monitored Patient Re-evaluated:Patient Re-evaluated prior to inductionOxygen Delivery Method: Circle system utilized Preoxygenation: Pre-oxygenation with 100% oxygen Intubation Type: IV induction Ventilation: Mask ventilation without difficulty LMA Size: 5.0 Tube type: Oral Number of attempts: 1 Placement Confirmation: positive ETCO2 and breath sounds checked- equal and bilateral Tube secured with: Tape Dental Injury: Teeth and Oropharynx as per pre-operative assessment

## 2016-08-03 NOTE — Transfer of Care (Signed)
Immediate Anesthesia Transfer of Care Note  Patient: [name] Moore  Procedure(s) Performed: Procedure(s): RIGHT KNEE ARTHROSCOPY WITH PARTIAL MEDIAL MENISCECTOMY (Right)  Patient Location: PACU  Anesthesia Type:General  Level of Consciousness: awake, alert  and oriented  Airway & Oxygen Therapy: Patient Spontanous Breathing and Patient connected to nasal cannula oxygen  Post-op Assessment: Report given to RN and Post -op Vital signs reviewed and stable  Post vital signs: Reviewed and stable  Last Vitals:  Vitals:   08/03/16 1034  BP: 127/81  Pulse: 86  Resp: 18  Temp: 36.7 C    Last Pain:  Vitals:   08/03/16 1048  TempSrc:   PainSc: 7       Patients Stated Pain Goal: 4 (08/03/16 1048)  Complications: No apparent anesthesia complications

## 2016-08-03 NOTE — Anesthesia Preprocedure Evaluation (Addendum)
Anesthesia Evaluation  Patient identified by MRN, date of birth, ID band Patient awake    Reviewed: Allergy & Precautions, NPO status , Patient's Chart, lab work & pertinent test results  History of Anesthesia Complications Negative for: history of anesthetic complications  Airway Mallampati: II  TM Distance: >3 FB Neck ROM: Full    Dental no notable dental hx. (+) Dental Advisory Given   Pulmonary neg pulmonary ROS,    Pulmonary exam normal breath sounds clear to auscultation       Cardiovascular negative cardio ROS Normal cardiovascular exam Rhythm:Regular Rate:Normal     Neuro/Psych negative neurological ROS  negative psych ROS   GI/Hepatic negative GI ROS, Neg liver ROS,   Endo/Other  obesity  Renal/GU negative Renal ROS  negative genitourinary   Musculoskeletal negative musculoskeletal ROS (+)   Abdominal   Peds negative pediatric ROS (+)  Hematology negative hematology ROS (+)   Anesthesia Other Findings   Reproductive/Obstetrics negative OB ROS                             Anesthesia Physical Anesthesia Plan  ASA: II  Anesthesia Plan: General   Post-op Pain Management:    Induction: Intravenous  Airway Management Planned: LMA  Additional Equipment:   Intra-op Plan:   Post-operative Plan:   Informed Consent: I have reviewed the patients History and Physical, chart, labs and discussed the procedure including the risks, benefits and alternatives for the proposed anesthesia with the patient or authorized representative who has indicated his/her understanding and acceptance.   Dental advisory given  Plan Discussed with: CRNA  Anesthesia Plan Comments:        Anesthesia Quick Evaluation

## 2016-08-03 NOTE — H&P (Signed)
See H&P from 07/06/1016.  Nothing has changed since his H&P last month.  Surgery was canceled that day since the patient had a full cup of coffee before surgery.  He understands fully that were will proceed to surgery today for a right knee arthroscopy to address his medial meniscal tear.  The risks and benefits were discussed in detail.  Informed consent is obtained.

## 2016-08-03 NOTE — Brief Op Note (Signed)
08/03/2016  3:06 PM  PATIENT:  [name] Moore  45 y.o. male  PRE-OPERATIVE DIAGNOSIS:  RIGHT KNEE MEDIAL MENISCLE TEAR  POST-OPERATIVE DIAGNOSIS:  RIGHT KNEE MEDIAL MENISCLE TEAR  PROCEDURE:  Procedure(s): RIGHT KNEE ARTHROSCOPY WITH PARTIAL MEDIAL MENISCECTOMY (Right)  SURGEON:  Surgeon(s) and Role:    * Kathryne Hitchhristopher Y Timoty Bourke, MD - Primary  ANESTHESIA:   local and general  LOCAL MEDICATIONS USED:  MARCAINE     DICTATION: .Other Dictation: Dictation Number [id number]  PLAN OF CARE: Discharge to home after PACU  PATIENT DISPOSITION:  PACU - hemodynamically stable.   Delay start of Pharmacological VTE agent (>24hrs) due to surgical blood loss or risk of bleeding: not applicable

## 2016-08-04 NOTE — Op Note (Signed)
[name] Childes:  Moore, [name] Moore            ACCOUNT NO.:  [id number]  MEDICAL RECORD NO.:  [id number]  LOCATION:  [facility]                         [facility]  PHYSICIAN:  Vanita PandaChristopher Y. Magnus IvanBlackman, M.D.DATE OF BIRTH:  1971/05/24  DATE OF PROCEDURE:  08/03/2016 DATE OF DISCHARGE:  08/03/2016                              OPERATIVE REPORT   PREOPERATIVE DIAGNOSIS:  Right knee symptomatic horizontal medial meniscal tear.  POSTOPERATIVE DIAGNOSIS:  Right knee symptomatic horizontal medial meniscal tear.  PROCEDURE:  Right knee arthroscopy with partial medial meniscectomy.  FINDINGS:  Right knee horizontal/bucket-handle tear from posterior horn to midbody.  SURGEON:  Vanita PandaChristopher Y. Magnus IvanBlackman M.D.  ANESTHESIA: 1. General. 2. Local with mixture of 4 mg of morphine with 0.25% plain     Sensorcaine.  BLOOD LOSS:  Minimal.  COMPLICATIONS:  None.  INDICATIONS:  [name] Moore is a 45 year old gentleman with locking, catching in his right knee and recurrent effusions.  He injured this knee sometime ago, but had a recurrent injury.  Original MRI had showed a small medial meniscal tear.  We repeated the MRI and it showed a much larger horizontal almost bucket-handle type tear.  He was having locking, catching, and definitely mechanical symptoms from this tear.  At this point, arthroscopic intervention has been recommended.  The risks and benefits of surgery were explained to him in detail and he did wish to proceed with surgery.  PROCEDURE DESCRIPTION:  After informed consent was obtained, appropriate right knee was marked.  He was brought to the operating room, placed supine on the operating table.  General anesthesia was then obtained. His right leg was prepped and draped from the thigh down the ankle with DuraPrep and sterile drapes including a sterile stockinette.  With the bed raised and a lateral leg post utilized, his right knee was flexed off the side of the table.  A time-out was called and he  was identified as correct patient and correct right knee.  I then made an anterolateral arthroscopy portal, inserted a cannula into the knee, went to the medial compartment, and made an anteromedial incision.  I probed the medial meniscus and found it to have a large horizontal tear.  Using straight biters, up-cutting biters, and arthroscopic shaver, I was able to perform partial medial meniscectomy still leaving him with meniscal tissue and debriding this back to a stable margin.  I probed the ACL and PCL and those were intact.  With the knee in a figure 4 position, the lateral meniscus and popliteus were intact.  Of note, the cartilage in the medial and lateral compartments was also intact.  Finally, with the knee in an extended position, we assessed the patellofemoral joint and did find a little bit of arthritic changes there and mainly just inflamed tissue.  We allowed fluid to lavage the knee and then removed all fluid from the knee and removed all instrumentation.  We closed the portal sites with interrupted nylon suture and then inserted a mixture of morphine and Sensorcaine into the portal sites and to the knee joint itself. Xeroform and well-padded sterile dressing were applied.  He was awakened, extubated, and taken to the recovery room in stable condition. All final counts were correct.  There were no complications noted.     Vanita Pandahristopher Y. Magnus IvanBlackman, M.D.     CYB/MEDQ  D:  08/03/2016  T:  08/04/2016  Job:  [id number]

## 2016-08-09 ENCOUNTER — Encounter (INDEPENDENT_AMBULATORY_CARE_PROVIDER_SITE_OTHER): Payer: Self-pay | Admitting: Physician Assistant

## 2016-08-09 ENCOUNTER — Ambulatory Visit (INDEPENDENT_AMBULATORY_CARE_PROVIDER_SITE_OTHER): Payer: Medicaid Other | Admitting: Physician Assistant

## 2016-08-09 DIAGNOSIS — Z9889 Other specified postprocedural states: Secondary | ICD-10-CM

## 2016-08-09 NOTE — Progress Notes (Signed)
   Office Visit Note   Patient: [name] Moore           Date of Birth: 1971/01/11           MRN: [id number] Visit Date: 08/09/2016              Requested by: No referring provider defined for this encounter. PCP: No PCP Per Patient   Assessment & Plan: Visit Diagnoses:  1. Status post arthroscopy of right knee     Plan: Physical therapy for range of motion of the knee. Sutures were removed  Follow-Up Instructions: Return in about 2 weeks (around 08/23/2016) [facility].   Orders:  No orders of the defined types were placed in this encounter.  No orders of the defined types were placed in this encounter.     Procedures: No procedures performed   Clinical Data: No additional findings.   Subjective: Chief Complaint  Patient presents with  . Right Knee - Routine Post Op, Pain, Follow-up    HPI  One-week status post knee arthroscopy. States that he needs to do physical therapy   Review of Systems   Objective: Vital Signs: There were no vitals taken for this visit.  Physical Exam  Alert and oriented that no distress   Ortho Exam  right knee well possibly with nylon sutures of the port sites. No signs of infection. Calve supple some slight tenderness.  specialty Comments:  No specialty comments available.  Imaging: No results found.   PMFS History: Patient Active Problem List   Diagnosis Date Noted  . Acute medial meniscal injury of knee, right, initial encounter 08/03/2016  . Acute lateral meniscus tear of right knee 07/06/2016  . Left shoulder pain 01/30/2016  . Cervical strain 12/30/2015  . Lumbar strain 12/30/2015  . Right knee injury 12/30/2015  . Chest wall contusion 12/30/2015   Past Medical History:  Diagnosis Date  . History of colon polyps     No family history on file.  Past Surgical History:  Procedure Laterality Date  . COLONOSCOPY     polyps removed  . KNEE ARTHROSCOPY WITH MEDIAL MENISECTOMY Right 08/03/2016   Procedure: RIGHT KNEE ARTHROSCOPY WITH PARTIAL MEDIAL MENISCECTOMY;  Surgeon: Kathryne Hitchhristopher Y Blackman, MD;  Location: [facility];  Service: Orthopedics;  Laterality: Right;   Social History   Occupational History  . Not on file.   Social History Main Topics  . Smoking status: Never Smoker  . Smokeless tobacco: Never Used  . Alcohol use No  . Drug use: No  . Sexual activity: Yes

## 2016-08-16 ENCOUNTER — Ambulatory Visit: Payer: Medicaid Other | Attending: Orthopaedic Surgery | Admitting: Physical Therapy

## 2016-08-16 DIAGNOSIS — R262 Difficulty in walking, not elsewhere classified: Secondary | ICD-10-CM | POA: Insufficient documentation

## 2016-08-16 DIAGNOSIS — M25661 Stiffness of right knee, not elsewhere classified: Secondary | ICD-10-CM | POA: Insufficient documentation

## 2016-08-16 DIAGNOSIS — G8929 Other chronic pain: Secondary | ICD-10-CM | POA: Diagnosis present

## 2016-08-16 DIAGNOSIS — M25561 Pain in right knee: Secondary | ICD-10-CM | POA: Insufficient documentation

## 2016-08-16 DIAGNOSIS — R2689 Other abnormalities of gait and mobility: Secondary | ICD-10-CM | POA: Insufficient documentation
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
## 2016-08-20 ENCOUNTER — Ambulatory Visit: Payer: Medicaid Other

## 2016-08-20 DIAGNOSIS — R2689 Other abnormalities of gait and mobility: Secondary | ICD-10-CM

## 2016-08-20 DIAGNOSIS — R262 Difficulty in walking, not elsewhere classified: Secondary | ICD-10-CM

## 2016-08-20 DIAGNOSIS — M25561 Pain in right knee: Secondary | ICD-10-CM | POA: Diagnosis not present

## 2016-08-20 DIAGNOSIS — G8929 Other chronic pain: Secondary | ICD-10-CM

## 2016-08-20 DIAGNOSIS — M25661 Stiffness of right knee, not elsewhere classified: Secondary | ICD-10-CM

## 2016-08-20 NOTE — Therapy (Addendum)
[name] Moore [address]  [place], [place], [address] Phone: [phone]   Fax:  [phone]  Physical Therapy Treatment  Patient Details  Name: [name] Moore MRN: [id number] Date of Birth: November 23, 1970 Referring Provider: Jean Rosenthal, MD  Encounter Date: 08/20/2016      PT End of Session - 08/20/16 0852    Visit Number 2   Number of Visits 16   Date for PT Re-Evaluation 10/26/16   PT Start Time 9242   PT Stop Time 0927   PT Time Calculation (min) 40 min   Activity Tolerance Patient tolerated treatment well   Behavior During Therapy [facility] for tasks assessed/performed      Past Medical History:  Diagnosis Date  . History of colon polyps     Past Surgical History:  Procedure Laterality Date  . COLONOSCOPY     polyps removed  . KNEE ARTHROSCOPY WITH MEDIAL MENISECTOMY Right 08/03/2016   Procedure: RIGHT KNEE ARTHROSCOPY WITH PARTIAL MEDIAL MENISCECTOMY;  Surgeon: [name] Rossetti, MD;  Location: [facility];  Service: Orthopedics;  Laterality: Right;    There were no vitals filed for this visit.      Subjective Assessment - 08/20/16 0850    Subjective Pt. reports his knee pain has been less over weekend.     Patient Stated Goals "To move and walk w/o pain"   Currently in Pain? Yes   Pain Score 4    Pain Location Knee   Pain Orientation Right;Anterior;Medial   Pain Descriptors / Indicators Pressure   Pain Type Surgical pain   Pain Onset 1 to 4 weeks ago   Pain Frequency Constant   Multiple Pain Sites No       Today's treatment:   Therex: NuStep: lvl 4, 7 min   HEP review: R ITB stretch with strap x 30 sec  R HS stretch with strap x 30 sec R Quad set 5" x 10 reps  Supine R knee bands with strap assist 5" x 20 reps R SLR 3" x 10 reps  Therex: Hooklying bridge with adduction ball squeeze 3" x 10 reps Hooklying adduction ball squeeze 5' x 10 reps  Manual (increased time taken  for all stretchs due to muscular guarding): L sidelying R ITB stretch x 1 min  R ITB stretch x 1 min R HS stretch x 1 min                 [facility] Adult PT Treatment/Exercise - 08/20/16 1032      Iontophoresis   Type of Iontophoresis Dexamethasone   Location medial knee    Dose 1.0 ml    Time 4-6 hrs                 PT Education - 08/20/16 1216    Education provided Yes   Education Details Ionto precautions education handout sheet issued to pt. via    Person(s) Educated Patient   Methods Explanation;Handout   Comprehension Verbalized understanding;Verbal cues required          PT Short Term Goals - 08/20/16 0852      PT SHORT TERM GOAL #1   Title Independent with intial HEP by 08/30/16   Status On-going           PT Long Term Goals - 08/20/16 0853      PT LONG TERM GOAL #1   Title independent with advanced HEP as indicated by 10/26/16   Status On-going  PT LONG TERM GOAL #2   Title R knee AROM 0-130 or greater w/o pain by 10/26/16   Status On-going     PT LONG TERM GOAL #3   Title R hip and knee strength >/= 4+/5 by 10/26/16   Status On-going     PT LONG TERM GOAL #4   Title Pt will report ability to transfer sit <> stand w/o limitation due to R knee ROM or weakness by 10/26/16   Status On-going     PT LONG TERM GOAL #5   Title Pt will ascend/descend strairs recpirocally with normal pattern by 10/26/16   Status On-going     PT LONG TERM GOAL #6   Title Pt will perform normal daily household chores and job tasks w/o limitation due to R knee pain or weakness by 10/26/16   Status On-going               Plan - 08/20/16 0853    Clinical Impression Statement Pt. with 4/10 pressure pain in R knee initially today which remained unchanged throughout therex.  Pt. demo'd good recall of HEP today with good technique and reporting adherence at this Moore.  Iontophoresis initiated to R knee today due to MD signing order.  Ionto patch #1/6  applied to pt. R medial knee with education sheet on precautions issued to pt. via handout.  Will plan to monitor response to ionto and progress strengthening activity per pt. tolerance.     PT Treatment/Interventions Patient/family education;Therapeutic exercise;Manual techniques;Passive range of motion;Taping;Scar mobilization;Neuromuscular re-education;Therapeutic activities;Functional mobility training;Gait training;Stair training;Iontophoresis 65m/ml Dexamethasone;Electrical Stimulation;Cryotherapy;Vasopneumatic Device;ADLs/Self Care Home Management   PT Next Visit Plan Ionto patch #2/6 prn; R knee ROM; Hip & knee strengthening; Manual therapy for increased ROM PRN; Modalities PRN (ionto included in inital MD order)      Patient will benefit from skilled therapeutic intervention in order to improve the following deficits and impairments:  Pain, Decreased range of motion, Impaired flexibility, Decreased strength, Impaired perceived functional ability, Difficulty walking, Abnormal gait, Decreased activity tolerance  Visit Diagnosis: Acute pain of right knee  Chronic pain of right knee  Stiffness of right knee, not elsewhere classified  Difficulty in walking, not elsewhere classified  Other abnormalities of gait and mobility     Problem List Patient Active Problem List   Diagnosis Date Noted  . Acute medial meniscal injury of knee, right, initial encounter 08/03/2016  . Acute lateral meniscus tear of right knee 07/06/2016  . Left shoulder pain 01/30/2016  . Cervical strain 12/30/2015  . Lumbar strain 12/30/2015  . Right knee injury 12/30/2015  . Chest wall contusion 12/30/2015    [name] Moore 08/20/16 12:22 PM  [name] Moore. [name] Moore  Fax:  [phone] Name: [name] BruunMRN: [id number] of Birth: [birth year]  PHYSICAL THERAPY DISCHARGE  SUMMARY  Visits from Start of Care: 2  Current functional level related to goals / functional outcomes:    Unable to formally assess status at discharge as pt only returned for 1 therapy visit following the eval, but per most recent MD note, pt was planning on continuing on his own with his HEP for quad strengthening.   Remaining deficits:   Unable to assess due to failure to return to PT   Education / Equipment:   HEP  Plan: Patient agrees to discharge.  Patient goals were not met. Patient is being  discharged due to not returning since the last visit.  ?????    Percival Spanish, PT, MPT 09/17/16, 2:35 PM  [facility] [address]  [place], [place], [address] Phone: [phone]   Fax:  [phone]

## 2016-08-20 NOTE — Patient Instructions (Signed)

## 2016-08-22 ENCOUNTER — Ambulatory Visit: Payer: Medicaid Other

## 2016-08-27 ENCOUNTER — Ambulatory Visit (INDEPENDENT_AMBULATORY_CARE_PROVIDER_SITE_OTHER): Payer: Medicaid Other | Admitting: Orthopaedic Surgery

## 2016-08-27 DIAGNOSIS — S838X1A Sprain of other specified parts of right knee, initial encounter: Secondary | ICD-10-CM
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
## 2016-08-28 ENCOUNTER — Ambulatory Visit: Payer: Medicaid Other

## 2016-09-04 ENCOUNTER — Ambulatory Visit: Payer: Medicaid Other | Admitting: Physical Therapy

## 2016-09-06 ENCOUNTER — Ambulatory Visit: Payer: Medicaid Other

## 2016-09-11 ENCOUNTER — Ambulatory Visit: Payer: No Typology Code available for payment source

## 2016-09-13 ENCOUNTER — Ambulatory Visit: Payer: No Typology Code available for payment source | Admitting: Physical Therapy

## 2016-09-18 ENCOUNTER — Ambulatory Visit: Payer: No Typology Code available for payment source

## 2016-09-20 ENCOUNTER — Ambulatory Visit: Payer: No Typology Code available for payment source | Admitting: Physical Therapy
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
## 2017-06-24 ENCOUNTER — Telehealth (INDEPENDENT_AMBULATORY_CARE_PROVIDER_SITE_OTHER): Payer: Self-pay | Admitting: Orthopaedic Surgery

## 2017-06-24 NOTE — Telephone Encounter (Signed)
Received call from [facility] at [facility] office checking on questionnaire for Dr. Magnus Ivan. I checked my fax place and was faxed to them on 8/31, but failed. I re faxed [phone]
